# Patient Record
Sex: Male | Born: 1990 | Race: Black or African American | Hispanic: No | Marital: Single | Smoking: Current every day smoker
Health system: Southern US, Community
[De-identification: ages and names within clinical notes are randomized; demographics above are authoritative.]

## PROBLEM LIST (undated history)

## (undated) DIAGNOSIS — J45909 Unspecified asthma, uncomplicated: Secondary | ICD-10-CM

## (undated) HISTORY — PX: KNEE SURGERY: SHX244

---

## 2016-04-19 ENCOUNTER — Emergency Department (HOSPITAL_COMMUNITY)
Admission: EM | Admit: 2016-04-19 | Discharge: 2016-04-19 | Disposition: A | Discharge status: Home / Self Care | Payer: Self-pay | Attending: Emergency Medicine | Admitting: Emergency Medicine

## 2016-04-19 ENCOUNTER — Encounter (HOSPITAL_COMMUNITY): Payer: Self-pay | Admitting: *Deleted

## 2016-04-19 DIAGNOSIS — K0889 Other specified disorders of teeth and supporting structures: Secondary | ICD-10-CM | POA: Insufficient documentation

## 2016-04-19 DIAGNOSIS — Z79899 Other long term (current) drug therapy: Secondary | ICD-10-CM | POA: Insufficient documentation

## 2016-04-19 DIAGNOSIS — F17200 Nicotine dependence, unspecified, uncomplicated: Secondary | ICD-10-CM | POA: Insufficient documentation

## 2016-04-19 MED ORDER — NAPROXEN 500 MG PO TABS: 500.0000 mg | ORAL_TABLET | Freq: Two times a day (BID) | ORAL | 0 refills | Status: DC | PRN

## 2016-04-19 MED ORDER — PENICILLIN V POTASSIUM 500 MG PO TABS: 500.0000 mg | ORAL_TABLET | Freq: Four times a day (QID) | ORAL | 0 refills | Status: AC

## 2016-04-19 NOTE — ED Provider Notes (Signed)
[facility]-EMERGENCY DEPT Provider Note   CSN: [id number] Arrival date & time: 04/19/16  1534   By signing my name below, I, Clovis PuAvnee Patel, attest that this documentation has been prepared under the direction and in the presence of  [facility] HospitalEmily Gerhard Rappaport, PA-C. Electronically Signed: Clovis PuAvnee Patel, ED Scribe. 04/19/16. 4:04 PM.   History   Chief Complaint Chief Complaint  Patient presents with  . Dental Pain   The history is provided by the patient. No language interpreter was used.   HPI Comments:  Joe [name] is a 25 y.o. male, with a hx of dental pain, who presents to the Emergency Department complaining of sudden onset, moderate, right lower dental pain x yesterday. He states he has been intermittently experiencing these episodes for several months. Pt states he cannot talk properly due to the pain. He notes a hx of similar problem to another tooth which was later extracted by a dentist. He has tried Orajel with no relief. Pt denies facial swelling (states this has happened with previous episodes of pain), fevers, sore throat, trouble swallowing, any other associated symptoms any other and modifying factors at this time.    History reviewed. No pertinent past medical history.  There are no active problems to display for this patient.   History reviewed. No pertinent surgical history.   Home Medications    Prior to Admission medications   Medication Sig Start Date End Date Taking? Authorizing Provider  naproxen (NAPROSYN) 500 MG tablet Take 1 tablet (500 mg total) by mouth 2 (two) times daily as needed for mild pain or moderate pain. 04/19/16   Trixie DredgeEmily Christan Defranco, PA-C  penicillin v potassium (VEETID) 500 MG tablet Take 1 tablet (500 mg total) by mouth 4 (four) times daily. 04/19/16 04/26/16  Trixie DredgeEmily Mazin Emma, PA-C    Family History No family history on file.  Social History Social History  Substance Use Topics  . Smoking status: Current Every Day Smoker  . Smokeless tobacco: Never Used  . Alcohol use  Yes     Allergies   Patient has no allergy information on record.   Review of Systems Review of Systems  Constitutional: Negative for fever.  HENT: Positive for dental problem. Negative for facial swelling, sore throat and trouble swallowing.   Respiratory: Negative for shortness of breath and stridor.   Musculoskeletal: Negative for neck pain and neck stiffness.  Skin: Negative for color change.  Allergic/Immunologic: Negative for immunocompromised state.  Psychiatric/Behavioral: Negative for self-injury.     Physical Exam Updated Vital Signs BP 152/87 (BP Location: Right Arm)   Pulse 90   Temp 99 F (37.2 C) (Oral)   Resp 18   SpO2 100%   Physical Exam  Constitutional: He appears well-developed and well-nourished. No distress.  HENT:  Head: Normocephalic and atraumatic.  Mouth/Throat: Uvula is midline and oropharynx is clear and moist. Mucous membranes are not dry. No uvula swelling. No oropharyngeal exudate, posterior oropharyngeal edema, posterior oropharyngeal erythema or tonsillar abscesses.  Right lower second molar with remote fracture and tenderness to percussion. No obvious abscess or facial swelling   Neck: Normal range of motion. Neck supple.  Cardiovascular: Normal rate.   Pulmonary/Chest: Effort normal and breath sounds normal. No stridor.  Lymphadenopathy:    He has no cervical adenopathy.  Neurological: He is alert.  Skin: He is not diaphoretic.  Nursing note and vitals reviewed.    ED Treatments / Results  DIAGNOSTIC STUDIES:  Oxygen Saturation is 100% on RA, normal by my interpretation.  COORDINATION OF CARE:  3:59 PM Discussed treatment plan with pt at bedside and pt agreed to plan.  Labs (all labs ordered are listed, but only abnormal results are displayed) Labs Reviewed - No data to display  EKG  EKG Interpretation None       Radiology No results found.  Procedures Procedures (including critical care time)  Medications  Ordered in ED Medications - No data to display   Initial Impression / Assessment and Plan / ED Course  I have reviewed the triage vital signs and the nursing notes.  Pertinent labs & imaging results that were available during my care of the patient were reviewed by me and considered in my medical decision making (see chart for details).  Clinical Course     Afebrile nontoxic patient with dentalgia.  No abscess requiring immediate incision and drainage.  Exam not concerning for Ludwig's angina or pharyngeal abscess.  Will treat with penicillin, naprosyn, dental follow up.  Pt instructed to follow-up with dentist.  Discussed return precautions. Pt safe for discharge.  Discussed result, findings, treatment, and follow up  with patient.  Pt given return precautions.  Pt verbalizes understanding and agrees with plan.      Final Clinical Impressions(s) / ED Diagnoses   Final diagnoses:  Pain, dental    New Prescriptions Discharge Medication List as of 04/19/2016  4:03 PM    START taking these medications   Details  naproxen (NAPROSYN) 500 MG tablet Take 1 tablet (500 mg total) by mouth 2 (two) times daily as needed for mild pain or moderate pain., Starting Sun 04/19/2016, Print    penicillin v potassium (VEETID) 500 MG tablet Take 1 tablet (500 mg total) by mouth 4 (four) times daily., Starting Sun 04/19/2016, Until Sun 04/26/2016, Print        I personally performed the services described in this documentation, which was scribed in my presence. The recorded information has been reviewed and is accurate.     Trixie Dredgemily Mendi Constable, PA-C 04/19/16 1703    Arby BarretteMarcy Pfeiffer, MD 04/22/16 [phone]

## 2016-04-19 NOTE — ED Triage Notes (Signed)
Pt complains of pain in his right lower wisdom tooth for the past 2 days. Pt states pain is 7/10.

## 2016-04-19 NOTE — Discharge Instructions (Signed)
Read the information below.  Use the prescribed medication as directed.  Please discuss all new medications with your pharmacist.  You may return to the Emergency Department at any time for worsening condition or any new symptoms that concern you.   Please call the dentist listed above within 48 hours to schedule a close follow up appointment.  If you develop fevers, swelling in your face, difficulty swallowing or breathing, return to the ER immediately for a recheck.   °

## 2016-11-13 ENCOUNTER — Encounter (HOSPITAL_COMMUNITY): Payer: Self-pay | Admitting: Emergency Medicine

## 2016-11-13 ENCOUNTER — Emergency Department (HOSPITAL_COMMUNITY)
Admission: EM | Admit: 2016-11-13 | Discharge: 2016-11-13 | Disposition: A | Discharge status: Home / Self Care | Payer: Self-pay | Attending: Emergency Medicine | Admitting: Emergency Medicine

## 2016-11-13 DIAGNOSIS — R109 Unspecified abdominal pain: Secondary | ICD-10-CM | POA: Insufficient documentation

## 2016-11-13 DIAGNOSIS — Z202 Contact with and (suspected) exposure to infections with a predominantly sexual mode of transmission: Secondary | ICD-10-CM | POA: Insufficient documentation

## 2016-11-13 DIAGNOSIS — R112 Nausea with vomiting, unspecified: Secondary | ICD-10-CM | POA: Insufficient documentation

## 2016-11-13 DIAGNOSIS — F17210 Nicotine dependence, cigarettes, uncomplicated: Secondary | ICD-10-CM | POA: Insufficient documentation

## 2016-11-13 LAB — CBC
HCT: 42.6 % (ref 39.0–52.0)
HEMOGLOBIN: 14.3 g/dL (ref 13.0–17.0)
MCH: 30.5 pg (ref 26.0–34.0)
MCHC: 33.6 g/dL (ref 30.0–36.0)
MCV: 90.8 fL (ref 78.0–100.0)
Platelets: 257 10*3/uL (ref 150–400)
RBC: 4.69 MIL/uL (ref 4.22–5.81)
RDW: 12.6 % (ref 11.5–15.5)
WBC: 6.1 10*3/uL (ref 4.0–10.5)

## 2016-11-13 LAB — COMPREHENSIVE METABOLIC PANEL
ALBUMIN: 4.6 g/dL (ref 3.5–5.0)
ALK PHOS: 57 U/L (ref 38–126)
ALT: 11 U/L — AB (ref 17–63)
ANION GAP: 10 (ref 5–15)
AST: 23 U/L (ref 15–41)
BILIRUBIN TOTAL: 1.8 mg/dL — AB (ref 0.3–1.2)
BUN: 7 mg/dL (ref 6–20)
CALCIUM: 9.6 mg/dL (ref 8.9–10.3)
CO2: 26 mmol/L (ref 22–32)
Chloride: 105 mmol/L (ref 101–111)
Creatinine, Ser: 0.99 mg/dL (ref 0.61–1.24)
GFR calc Af Amer: >60 mL/min (ref >60)
GFR calc non Af Amer: >60 mL/min (ref >60)
GLUCOSE: 99 mg/dL (ref 65–99)
Potassium: 3.8 mmol/L (ref 3.5–5.1)
SODIUM: 141 mmol/L (ref 135–145)
Total Protein: 8.3 g/dL — ABNORMAL HIGH (ref 6.5–8.1)

## 2016-11-13 LAB — URINALYSIS, ROUTINE W REFLEX MICROSCOPIC
BILIRUBIN URINE: NEGATIVE
Glucose, UA: NEGATIVE mg/dL
HGB URINE DIPSTICK: NEGATIVE
KETONES UR: NEGATIVE mg/dL
Leukocytes, UA: NEGATIVE
Nitrite: NEGATIVE
PH: 5 (ref 5.0–8.0)
Protein, ur: NEGATIVE mg/dL
SPECIFIC GRAVITY, URINE: 1.018 (ref 1.005–1.030)

## 2016-11-13 MED ORDER — ONDANSETRON 4 MG PO TBDP: 4.0000 mg | ORAL_TABLET | Freq: Three times a day (TID) | ORAL | 0 refills | Status: DC | PRN

## 2016-11-13 MED ORDER — CEFTRIAXONE SODIUM 250 MG IJ SOLR
250.0000 mg | INTRAMUSCULAR | Status: DC
  Administered 2016-11-13: 250 mg via INTRAMUSCULAR
  Filled 2016-11-13: qty 250

## 2016-11-13 MED ORDER — AZITHROMYCIN 250 MG PO TABS
1000.0000 mg | ORAL_TABLET | Freq: Once | ORAL | Status: AC
  Administered 2016-11-13: 1000 mg via ORAL
  Filled 2016-11-13: qty 4

## 2016-11-13 MED ORDER — ONDANSETRON 4 MG PO TBDP
4.0000 mg | ORAL_TABLET | Freq: Once | ORAL | Status: AC
  Administered 2016-11-13: 4 mg via ORAL
  Filled 2016-11-13: qty 1

## 2016-11-13 MED ORDER — LIDOCAINE HCL (PF) 1 % IJ SOLN
INTRAMUSCULAR | Status: AC
  Administered 2016-11-13: 5 mL
  Filled 2016-11-13: qty 5

## 2016-11-13 MED ORDER — METRONIDAZOLE 500 MG PO TABS
2000.0000 mg | ORAL_TABLET | Freq: Once | ORAL | Status: AC
  Administered 2016-11-13: 2000 mg via ORAL
  Filled 2016-11-13: qty 4

## 2016-11-13 NOTE — ED Notes (Signed)
Pt unable to pee. Fluid given.

## 2016-11-13 NOTE — ED Provider Notes (Signed)
Emergency Department Provider Note   I have reviewed the triage vital signs and the nursing notes.   HISTORY  Chief Complaint Emesis and Exposure to STD   HPI Joe [name] is a 26 y.o. male with PMH of marijuana use presents to the emergency department for evaluation of multiple episodes of vomiting over the past 3 weeks and known STD exposure. The patient states that both he and his girlfriend has been having intermittent vomiting over the last 3 weeks. His last vomiting episode was 3 days ago. He states at that time he was here in the emergency department with his girlfriend when she tested positive for trichomonas. She was treated and he was encouraged to also be treated. He reports some occasional abdominal discomfort with vomiting but nothing persistent. Denies any burning with urination or urethral discharge. He does note some cloudy urination for the past 3 weeks.  Patient states that his vomiting does seem to have improved over the past 2 days. He smokes marijuana daily states that it typically makes his nausea and vomiting better as opposed to worse. No associated diarrhea. No other sick contacts other than his girlfriend.  History reviewed. No pertinent past medical history.  There are no active problems to display for this patient.   History reviewed. No pertinent surgical history.  Current Outpatient Rx  . Order #: [id number] Class: Print  . Order #: [id number] Class: Print    Allergies Patient has no allergy information on record.  History reviewed. No pertinent family history.  Social History Social History  Substance Use Topics  . Smoking status: Current Every Day Smoker  . Smokeless tobacco: Never Used  . Alcohol use Yes    Review of Systems  Constitutional: No fever/chills Eyes: No visual changes. ENT: No sore throat. Cardiovascular: Denies chest pain. Respiratory: Denies shortness of breath. Gastrointestinal: No abdominal pain. Positive nausea and  vomiting.  No diarrhea.  No constipation. Genitourinary: Negative for dysuria. Positive cloudy urination. Positive STD exposure.  Musculoskeletal: Negative for back pain. Skin: Negative for rash. Neurological: Negative for headaches, focal weakness or numbness.  10-point ROS otherwise negative.  ____________________________________________   PHYSICAL EXAM:  VITAL SIGNS: ED Triage Vitals  Enc Vitals Group     BP 11/13/16 1552 (!) 141/93     Pulse Rate 11/13/16 1552 (!) 115     Resp 11/13/16 1552 18     Temp 11/13/16 1552 98.5 F (36.9 C)     Temp Source 11/13/16 1552 Oral     SpO2 11/13/16 1552 98 %     Weight --      Height 11/13/16 1552 5\' 11"  (1.803 m)     Pain Score 11/13/16 1551 0   Constitutional: Alert and oriented. Well appearing and in no acute distress. Eyes: Conjunctivae are normal. Head: Atraumatic. Nose: No congestion/rhinnorhea. Mouth/Throat: Mucous membranes are moist. Neck: No stridor. Cardiovascular: Normal rate, regular rhythm. Good peripheral circulation. Grossly normal heart sounds.   Respiratory: Normal respiratory effort.  No retractions. Lungs CTAB. Gastrointestinal: Soft and nontender. No distention.  Musculoskeletal: No lower extremity tenderness nor edema. No gross deformities of extremities. Neurologic:  Normal speech and language. No gross focal neurologic deficits are appreciated.  Skin:  Skin is warm, dry and intact. No rash noted.  ____________________________________________   LABS (all labs ordered are listed, but only abnormal results are displayed)  Labs Reviewed  COMPREHENSIVE METABOLIC PANEL - Abnormal; Notable for the following:       Result Value   Total Protein  8.3 (*)    ALT 11 (*)    Total Bilirubin 1.8 (*)    All other components within normal limits  URINE CULTURE  CBC  URINALYSIS, ROUTINE W REFLEX MICROSCOPIC  GC/CHLAMYDIA PROBE AMP ([place]    ____________________________________________   PROCEDURES  Procedure(s) performed:   Procedures  None ____________________________________________   INITIAL IMPRESSION / ASSESSMENT AND PLAN / ED COURSE  Pertinent labs & imaging results that were available during my care of the patient were reviewed by me and considered in my medical decision making (see chart for details).  Patient presents to the emergency department for evaluation of intermittent vomiting over the last 3 weeks that seems to be improving. He is also concerned about STD exposure. His girlfriend tested positive for trichomonas. Labs from triage including UA are negative. Plan to send her urine for gonorrhea and chlamydia. Will treat empirically for trichomonas, chlamydia, gonorrhea. Patient has had no vomiting for the past 2-3 days. We'll discharge home with Zofran. I discussed that if vomiting and nausea persist he may be developing a cyclical vomiting type reaction to marijuana and he should try and decrease the amount he smokes. HR normalized from triage.   At this time, I do not feel there is any life-threatening condition present. I have reviewed and discussed all results (EKG, imaging, lab, urine as appropriate), exam findings with patient. I have reviewed nursing notes and appropriate previous records.  I feel the patient is safe to be discharged home without further emergent workup. Discussed usual and customary return precautions. Patient and family (if present) verbalize understanding and are comfortable with this plan.  Patient will follow-up with their primary care provider. If they do not have a primary care provider, information for follow-up has been provided to them. All questions have been answered.  ____________________________________________  FINAL CLINICAL IMPRESSION(S) / ED DIAGNOSES  Final diagnoses:  Non-intractable vomiting with nausea, unspecified vomiting type  STD exposure      MEDICATIONS GIVEN DURING THIS VISIT:  Medications  cefTRIAXone (ROCEPHIN) injection 250 mg (250 mg Intramuscular Given 11/13/16 2142)  azithromycin (ZITHROMAX) tablet 1,000 mg (1,000 mg Oral Given 11/13/16 2141)  metroNIDAZOLE (FLAGYL) tablet 2,000 mg (2,000 mg Oral Given 11/13/16 2142)  ondansetron (ZOFRAN-ODT) disintegrating tablet 4 mg (4 mg Oral Given 11/13/16 2142)  lidocaine (PF) (XYLOCAINE) 1 % injection (5 mLs  Given 11/13/16 2142)     NEW OUTPATIENT MEDICATIONS STARTED DURING THIS VISIT:  Discharge Medication List as of 11/13/2016  8:28 PM    START taking these medications   Details  ondansetron (ZOFRAN ODT) 4 MG disintegrating tablet Take 1 tablet (4 mg total) by mouth every 8 (eight) hours as needed for nausea or vomiting., Starting Fri 11/13/2016, Print          Note:  This document was prepared using Dragon voice recognition software and may include unintentional dictation errors.  Alona BeneJoshua Nevah Dalal, MD Emergency Medicine    Joe Gee, Arlyss RepressJoshua G, MD 11/14/16 [id number]

## 2016-11-13 NOTE — Discharge Instructions (Signed)

## 2016-11-13 NOTE — ED Notes (Signed)
Pt verbalized understanding discharge instructions and denies any further needs or questions at this time. VS stable, ambulatory and steady gait.   

## 2016-11-13 NOTE — ED Triage Notes (Signed)
Pt to ER for evaluation of emesis x20+ episodes for 12 days, states however he has not had any vomiting in 3 days. Also here because states girlfriend was diagnosed with STD (pt does not know what) and was told he needed to be checked. States urine is cloudy but denies discharge or pain. Pt is a/o x4.

## 2016-11-15 LAB — URINE CULTURE: Culture: <10000 — AB

## 2016-11-16 LAB — GC/CHLAMYDIA PROBE AMP (~~LOC~~) NOT AT ARMC
Chlamydia: NEGATIVE
Neisseria Gonorrhea: NEGATIVE

## 2017-01-11 ENCOUNTER — Emergency Department (HOSPITAL_COMMUNITY)
Admission: EM | Admit: 2017-01-11 | Discharge: 2017-01-11 | Disposition: A | Discharge status: Home / Self Care | Payer: Self-pay | Attending: Emergency Medicine | Admitting: Emergency Medicine

## 2017-01-11 ENCOUNTER — Encounter (HOSPITAL_COMMUNITY): Payer: Self-pay | Admitting: *Deleted

## 2017-01-11 DIAGNOSIS — Y929 Unspecified place or not applicable: Secondary | ICD-10-CM | POA: Insufficient documentation

## 2017-01-11 DIAGNOSIS — Y939 Activity, unspecified: Secondary | ICD-10-CM | POA: Insufficient documentation

## 2017-01-11 DIAGNOSIS — Y998 Other external cause status: Secondary | ICD-10-CM | POA: Insufficient documentation

## 2017-01-11 DIAGNOSIS — S51812A Laceration without foreign body of left forearm, initial encounter: Secondary | ICD-10-CM

## 2017-01-11 DIAGNOSIS — Z23 Encounter for immunization: Secondary | ICD-10-CM | POA: Insufficient documentation

## 2017-01-11 DIAGNOSIS — F17200 Nicotine dependence, unspecified, uncomplicated: Secondary | ICD-10-CM | POA: Insufficient documentation

## 2017-01-11 DIAGNOSIS — J45909 Unspecified asthma, uncomplicated: Secondary | ICD-10-CM | POA: Insufficient documentation

## 2017-01-11 DIAGNOSIS — W260XXA Contact with knife, initial encounter: Secondary | ICD-10-CM | POA: Insufficient documentation

## 2017-01-11 HISTORY — DX: Unspecified asthma, uncomplicated: J45.909

## 2017-01-11 MED ORDER — TETANUS-DIPHTH-ACELL PERTUSSIS 5-2.5-18.5 LF-MCG/0.5 IM SUSP
0.5000 mL | Freq: Once | INTRAMUSCULAR | Status: AC
  Administered 2017-01-11: 0.5 mL via INTRAMUSCULAR
  Filled 2017-01-11: qty 0.5
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
## 2017-01-11 NOTE — Discharge Instructions (Signed)
It was my pleasure taking care of you today!   You do not have to bandage your cut. The adhesive glue I applied works like a bandage. Do not use antibiotic ointment as it can break down the adhesive. You can shower while the adhesive is on your skin, but do not take a bath or soak or scrub the area for 7 to 10 days. Dry your skin by patting it gently with a towel.  The adhesive will peel off on its own, usually by 5 to 10 days. If after 10 days, you still have adhesive on you, you can use antibiotic ointment or petroleum jelly to get it off. You do not need to see the doctor again unless the wound doesn?t heal well or you have signs of infection, such as redness, swelling, or pus.  Return to ER for fever, redness or swelling around the cut, pus drains from the cut, new or worsening symptoms, any additional concerns.
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
## 2017-07-14 ENCOUNTER — Emergency Department (HOSPITAL_COMMUNITY)
Admission: EM | Admit: 2017-07-14 | Discharge: 2017-07-15 | Disposition: A | Discharge status: Home / Self Care | Payer: Self-pay | Attending: Emergency Medicine | Admitting: Emergency Medicine

## 2017-07-14 ENCOUNTER — Encounter (HOSPITAL_COMMUNITY): Payer: Self-pay | Admitting: Emergency Medicine

## 2017-07-14 DIAGNOSIS — K0889 Other specified disorders of teeth and supporting structures: Secondary | ICD-10-CM

## 2017-07-14 DIAGNOSIS — J45909 Unspecified asthma, uncomplicated: Secondary | ICD-10-CM | POA: Insufficient documentation

## 2017-07-14 DIAGNOSIS — F17200 Nicotine dependence, unspecified, uncomplicated: Secondary | ICD-10-CM | POA: Insufficient documentation

## 2017-07-14 DIAGNOSIS — K029 Dental caries, unspecified: Secondary | ICD-10-CM | POA: Insufficient documentation

## 2017-07-14 NOTE — ED Triage Notes (Signed)
Reports having bad tooth on left lower side with a tooth that broke off last year.  Noted to be red and tender to touch.

## 2017-07-15 MED ORDER — PENICILLIN V POTASSIUM 250 MG PO TABS
500.0000 mg | ORAL_TABLET | Freq: Once | ORAL | Status: AC
  Administered 2017-07-15: 500 mg via ORAL
  Filled 2017-07-15: qty 2

## 2017-07-15 MED ORDER — PENICILLIN V POTASSIUM 500 MG PO TABS: 500.0000 mg | ORAL_TABLET | Freq: Four times a day (QID) | ORAL | 0 refills | Status: AC

## 2017-07-15 MED ORDER — NAPROXEN 250 MG PO TABS
500.0000 mg | ORAL_TABLET | Freq: Once | ORAL | Status: AC
  Administered 2017-07-15: 500 mg via ORAL
  Filled 2017-07-15: qty 2

## 2017-07-15 NOTE — ED Notes (Signed)
ED Provider at bedside. 

## 2017-07-15 NOTE — ED Provider Notes (Signed)
[facility] EMERGENCY DEPARTMENT Provider Note   CSN: [id number] Arrival date & time: 07/14/17  2222     History   Chief Complaint Chief Complaint  Patient presents with  . Dental Pain    HPI Joe [name] is a 27 y.o. male.  The history is provided by the patient. No language interpreter was used.  Dental Pain   This is a new problem. Episode onset: 1 week ago. The problem occurs daily. The problem has been gradually worsening. The pain is moderate. Treatments tried: ibuprofen 800mg . The treatment provided moderate relief.    Past Medical History:  Diagnosis Date  . Asthma     There are no active problems to display for this patient.   History reviewed. No pertinent surgical history.     Home Medications    Prior to Admission medications   Medication Sig Start Date End Date Taking? Authorizing Provider  naproxen (NAPROSYN) 500 MG tablet Take 1 tablet (500 mg total) by mouth 2 (two) times daily as needed for mild pain or moderate pain. 04/19/16   Trixie Dredge, PA-C  ondansetron (ZOFRAN ODT) 4 MG disintegrating tablet Take 1 tablet (4 mg total) by mouth every 8 (eight) hours as needed for nausea or vomiting. 11/13/16   Long, Arlyss Repress, MD  penicillin v potassium (VEETID) 500 MG tablet Take 1 tablet (500 mg total) by mouth 4 (four) times daily for 10 days. 07/15/17 07/25/17  Antony Madura, PA-C    Family History No family history on file.  Social History Social History   Tobacco Use  . Smoking status: Current Every Day Smoker  . Smokeless tobacco: Never Used  Substance Use Topics  . Alcohol use: Yes  . Drug use: Yes    Types: Marijuana     Allergies   Patient has no known allergies.   Review of Systems Review of Systems Ten systems reviewed and are negative for acute change, except as noted in the HPI.    Physical Exam Updated Vital Signs BP (!) 153/85 (BP Location: Right Arm)   Pulse 90   Temp 98.1 F (36.7 C) (Oral)   Resp 16   Ht  6' (1.829 m)   Wt 86.2 kg (190 lb)   SpO2 100%   BMI 25.77 kg/m   Physical Exam  Constitutional: He is oriented to person, place, and time. He appears well-developed and well-nourished. No distress.  Nontoxic appearing and in no distress  HENT:  Head: Normocephalic and atraumatic.  Gross dental decay and caries to the left lower wisdom tooth.  No trismus.  Patient tolerating secretions without difficulty.  No facial swelling.  Eyes: Conjunctivae and EOM are normal. No scleral icterus.  Neck: Normal range of motion.  No meningismus  Pulmonary/Chest: Effort normal. No respiratory distress.  Respirations even and unlabored  Musculoskeletal: Normal range of motion.  Neurological: He is alert and oriented to person, place, and time. He exhibits normal muscle tone. Coordination normal.  Skin: Skin is warm and dry. No rash noted. He is not diaphoretic. No erythema. No pallor.  Psychiatric: He has a normal mood and affect. His behavior is normal.  Nursing note and vitals reviewed.    ED Treatments / Results  Labs (all labs ordered are listed, but only abnormal results are displayed) Labs Reviewed - No data to display  EKG None  Radiology No results found.  Procedures Procedures (including critical care time)  Medications Ordered in ED Medications  penicillin v potassium (VEETID) tablet  500 mg (has no administration in time range)  naproxen (NAPROSYN) tablet 500 mg (has no administration in time range)     Initial Impression / Assessment and Plan / ED Course  I have reviewed the triage vital signs and the nursing notes.  Pertinent labs & imaging results that were available during my care of the patient were reviewed by me and considered in my medical decision making (see chart for details).     Patient with toothache.  No gross abscess.  Exam unconcerning for Ludwig's angina or spread of infection.  Will treat with penicillin and pain medicine.  Urged patient to follow-up  with dentist.  Return precautions discussed and provided. Patient discharged in stable condition with no unaddressed concerns.   Final Clinical Impressions(s) / ED Diagnoses   Final diagnoses:  Pain, dental    ED Discharge Orders        Ordered    penicillin v potassium (VEETID) 500 MG tablet  4 times daily     07/15/17 0011       Antony MaduraHumes, Breean Nannini, PA-C 07/15/17 0015    Ward, Layla MawKristen N, DO 07/15/17 [phone]

## 2017-10-14 ENCOUNTER — Other Ambulatory Visit: Payer: Self-pay

## 2017-10-15 ENCOUNTER — Emergency Department (HOSPITAL_COMMUNITY)
Admission: EM | Admit: 2017-10-15 | Discharge: 2017-10-16 | Disposition: A | Discharge status: Home / Self Care | Payer: Self-pay | Attending: Emergency Medicine | Admitting: Emergency Medicine

## 2017-10-15 ENCOUNTER — Encounter (HOSPITAL_COMMUNITY): Payer: Self-pay

## 2017-10-15 ENCOUNTER — Emergency Department (HOSPITAL_COMMUNITY): Payer: Self-pay

## 2017-10-15 ENCOUNTER — Other Ambulatory Visit: Payer: Self-pay

## 2017-10-15 DIAGNOSIS — F17210 Nicotine dependence, cigarettes, uncomplicated: Secondary | ICD-10-CM | POA: Insufficient documentation

## 2017-10-15 DIAGNOSIS — F1510 Other stimulant abuse, uncomplicated: Secondary | ICD-10-CM | POA: Diagnosis present

## 2017-10-15 DIAGNOSIS — F1220 Cannabis dependence, uncomplicated: Secondary | ICD-10-CM | POA: Insufficient documentation

## 2017-10-15 DIAGNOSIS — R45851 Suicidal ideations: Secondary | ICD-10-CM | POA: Insufficient documentation

## 2017-10-15 DIAGNOSIS — F315 Bipolar disorder, current episode depressed, severe, with psychotic features: Secondary | ICD-10-CM | POA: Insufficient documentation

## 2017-10-15 DIAGNOSIS — R55 Syncope and collapse: Secondary | ICD-10-CM

## 2017-10-15 DIAGNOSIS — J45909 Unspecified asthma, uncomplicated: Secondary | ICD-10-CM | POA: Insufficient documentation

## 2017-10-15 DIAGNOSIS — F322 Major depressive disorder, single episode, severe without psychotic features: Secondary | ICD-10-CM | POA: Diagnosis present

## 2017-10-15 LAB — RAPID URINE DRUG SCREEN, HOSP PERFORMED
AMPHETAMINES: NOT DETECTED
BENZODIAZEPINES: NOT DETECTED
COCAINE: NOT DETECTED
Opiates: NOT DETECTED
Tetrahydrocannabinol: POSITIVE — AB

## 2017-10-15 LAB — URINALYSIS, ROUTINE W REFLEX MICROSCOPIC
Bilirubin Urine: NEGATIVE
Glucose, UA: NEGATIVE mg/dL
HGB URINE DIPSTICK: NEGATIVE
KETONES UR: NEGATIVE mg/dL
Leukocytes, UA: NEGATIVE
NITRITE: NEGATIVE
Protein, ur: NEGATIVE mg/dL
Specific Gravity, Urine: 1.018 (ref 1.005–1.030)
pH: 6 (ref 5.0–8.0)

## 2017-10-15 LAB — BASIC METABOLIC PANEL
ANION GAP: 10 (ref 5–15)
BUN: 13 mg/dL (ref 6–20)
CO2: 26 mmol/L (ref 22–32)
Calcium: 9.6 mg/dL (ref 8.9–10.3)
Chloride: 106 mmol/L (ref 98–111)
Creatinine, Ser: 1.01 mg/dL (ref 0.61–1.24)
GLUCOSE: 86 mg/dL (ref 70–99)
Potassium: 3.8 mmol/L (ref 3.5–5.1)
Sodium: 142 mmol/L (ref 135–145)

## 2017-10-15 LAB — CBC
HEMATOCRIT: 43.7 % (ref 39.0–52.0)
HEMOGLOBIN: 14.1 g/dL (ref 13.0–17.0)
MCH: 30 pg (ref 26.0–34.0)
MCHC: 32.3 g/dL (ref 30.0–36.0)
MCV: 93 fL (ref 78.0–100.0)
Platelets: 220 10*3/uL (ref 150–400)
RBC: 4.7 MIL/uL (ref 4.22–5.81)
RDW: 12 % (ref 11.5–15.5)
WBC: 4.6 10*3/uL (ref 4.0–10.5)

## 2017-10-15 LAB — ETHANOL: Alcohol, Ethyl (B): <10 mg/dL (ref <10)

## 2017-10-15 LAB — LIPASE, BLOOD: LIPASE: 28 U/L (ref 11–51)

## 2017-10-15 NOTE — ED Provider Notes (Signed)
[facility] EMERGENCY DEPARTMENT Provider Note   CSN: [id number] Arrival date & time: 10/15/17  1500  History   Chief Complaint Chief Complaint  Patient presents with  . Loss of Consciousness  . Emesis    HPI Adem Costlow is a 27 y.o. male.  The history is provided by the patient and a relative.   27 yo M with bipolar disorder (not actively seeing psychiatry, not currently on medication) who presents with recurrently syncope x 3 months. States this occurs approximately once per week, though occurred twice today. States it occurs after he stands up and starts walking. Reports lightheadedness, occasional vomiting, palpitations, then syncope for a few seconds. Returns to baseline quickly. No chest pain or dyspnea. No leg swelling. No known cardiac history. No sz disorder. No family history of unexplained deaths or cardiac arrhythmias.  Admits to increased stress recently, endorses SI with plan to run in front of a car or kill himself with a gun, though he does not have access to one. Lives at home with his fiance and many family members, states he does not feel safe going home given his ongoing suicidal thoughts. Endorses occasional EtOH use, tobacco use, marijuana use. Denies recent head trauma, fevers, numbness, weakness, vision changes.   Past Medical History:  Diagnosis Date  . Asthma     There are no active problems to display for this patient.   History reviewed. No pertinent surgical history.      Home Medications    Prior to Admission medications   Medication Sig Start Date End Date Taking? Authorizing Provider  naproxen (NAPROSYN) 500 MG tablet Take 1 tablet (500 mg total) by mouth 2 (two) times daily as needed for mild pain or moderate pain. 04/19/16   Trixie Dredge, PA-C  ondansetron (ZOFRAN ODT) 4 MG disintegrating tablet Take 1 tablet (4 mg total) by mouth every 8 (eight) hours as needed for nausea or vomiting. 11/13/16   Long, Arlyss Repress, MD    Family  History History reviewed. No pertinent family history.  Social History Social History   Tobacco Use  . Smoking status: Current Every Day Smoker    Packs/day: 0.50    Types: Cigarettes  . Smokeless tobacco: Never Used  Substance Use Topics  . Alcohol use: Yes    Comment: occ  . Drug use: Yes    Types: Marijuana    Comment: occ     Allergies   Patient has no known allergies.   Review of Systems Review of Systems  Constitutional: Negative for chills and fever.  HENT: Negative for ear pain and sore throat.   Eyes: Negative for pain and visual disturbance.  Respiratory: Negative for cough and shortness of breath.   Cardiovascular: Negative for chest pain and palpitations.  Gastrointestinal: Negative for abdominal pain and vomiting.  Genitourinary: Negative for dysuria and hematuria.  Musculoskeletal: Negative for arthralgias and back pain.  Skin: Negative for color change and rash.  Neurological: Positive for syncope. Negative for seizures.  Psychiatric/Behavioral: Positive for dysphoric mood and suicidal ideas.  All other systems reviewed and are negative.    Physical Exam Updated Vital Signs BP 124/72 (BP Location: Right Arm)   Pulse 76   Temp 98.6 F (37 C) (Oral)   Resp 16   Ht 5\' 11"  (1.803 m)   Wt 95.3 kg (210 lb)   SpO2 100%   BMI 29.29 kg/m   Physical Exam  Constitutional: He is oriented to person, place, and time. He  appears well-developed and well-nourished.  HENT:  Head: Normocephalic and atraumatic.  Eyes: Pupils are equal, round, and reactive to light. Conjunctivae and EOM are normal.  Neck: Neck supple.  Cardiovascular: Normal rate, regular rhythm and intact distal pulses.  No murmur heard. Pulmonary/Chest: Effort normal and breath sounds normal. No stridor. No respiratory distress.  Abdominal: Soft. He exhibits no distension. There is no tenderness.  Musculoskeletal: Normal range of motion. He exhibits tenderness. He exhibits no edema.  LUE:  ttp over distal radius with no deformity, FROM, NVI distally, no tenderness over anatomic snuff box  Neurological: He is alert and oriented to person, place, and time. No cranial nerve deficit or sensory deficit.  CN II-XII intact, intact strength and sensation throughout, neg pronator drift  Skin: Skin is warm and dry.  Psychiatric: He has a normal mood and affect.  Nursing note and vitals reviewed.    ED Treatments / Results  Labs (all labs ordered are listed, but only abnormal results are displayed) Labs Reviewed  RAPID URINE DRUG SCREEN, HOSP PERFORMED - Abnormal; Notable for the following components:      Result Value   Tetrahydrocannabinol POSITIVE (*)    Barbiturates   (*)    Value: Result not available. Reagent lot number recalled by manufacturer.   All other components within normal limits  BASIC METABOLIC PANEL  CBC  URINALYSIS, ROUTINE W REFLEX MICROSCOPIC  LIPASE, BLOOD  ETHANOL    EKG EKG Interpretation  Date/Time:  Thursday October 14 2017 15:21:34 EDT Ventricular Rate:  88 PR Interval:  150 QRS Duration: 90 QT Interval:  348 QTC Calculation: 421 R Axis:   83 Text Interpretation:  Normal sinus rhythm Normal ECG NO STEMI No old tracing to compare Confirmed by Drema Pryardama, Pedro [id number]) on 10/15/2017 11:47:33 PM   Radiology Dg Chest 2 View  Result Date: 10/15/2017 CLINICAL DATA:  Vomiting and passing out EXAM: CHEST - 2 VIEW COMPARISON:  None. FINDINGS: The heart size and mediastinal contours are within normal limits. Both lungs are clear. The visualized skeletal structures are unremarkable. IMPRESSION: No active cardiopulmonary disease. Electronically Signed   By: Gerome Samavid  Williams III M.D   On: 10/15/2017 21:16   Dg Wrist Complete Left  Result Date: 10/15/2017 CLINICAL DATA:  Pain.  Syncope. EXAM: LEFT WRIST - COMPLETE 3+ VIEW COMPARISON:  None. FINDINGS: Lunotriquetral coalition. A lucency within this carpal bone is likely a bone cysts. An erosion is less likely. No  fractures identified. No other acute abnormalities. IMPRESSION: 1. Coalition of the lunate and triquetrum. The lucency within this carpal bone is likely a bone cyst. An erosion is considered less likely. No acute abnormalities seen. Electronically Signed   By: Gerome Samavid  Williams III M.D   On: 10/15/2017 21:18    Procedures Procedures (including critical care time)  Medications Ordered in ED Medications  ibuprofen (ADVIL,MOTRIN) tablet 600 mg (has no administration in time range)     Initial Impression / Assessment and Plan / ED Course  I have reviewed the triage vital signs and the nursing notes.  Pertinent labs & imaging results that were available during my care of the patient were reviewed by me and considered in my medical decision making (see chart for details).     Howie IllLeo Rackers is a 27 y.o. male with PMHx of bipolar disorder (not actively seeing psychiatry, not currently on medication) who p/w recurrent syncope. Reviewed and confirmed nursing documentation for past medical history, family history, social history. HDS. Exam remarkable for nl neurologic  and cardiopulmonary exam. Favor orthostatic lightheadness and syncope given symptoms occur when he stands up quickly. Has prodromal symptoms, less concerning for arrhythmia. Concern for mental health instability given worsening suicidal thoughts, unable to contract for safety.   Suicidal precautions given. EKG unremarkable. BMP wnl. CBC wnl. CXR neg. UDS + for THC. UA negative. Alcohol neg. Orthostatics wnl. Patient is medically clear at this time.   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 Patient states he has had ongoing issues with this wrist for many years. Recommended he continued to wear brace.   Consulted TTS, who performed telepsych eval. Patient providing different stories, now states SI was 1 year ago but reports conversations with his deceased family members. They believe he warrants  psychiatry consultation the AM.   Old records reviewed. Labs reviewed by me and used in the medical decision making.  Imaging viewed and interpreted by me and used in the medical decision making (formal interpretation from radiologist). EKG reviewed by me and used in the medical decision making. Care given over to [facility]. Placed in psych hold.    Final Clinical Impressions(s) / ED Diagnoses   Final diagnoses:  Syncope, unspecified syncope type  Suicidal ideation      Diannia Ruder, MD 10/16/17 Julienne Kass    Azalia Bilis, MD 10/16/17 2249

## 2017-10-15 NOTE — ED Notes (Signed)
Telepsych notified that pt is ready for telepsych. Pt will be moved into trauma B for telepsych.

## 2017-10-15 NOTE — ED Triage Notes (Signed)
Pt endorses "i've been throwing up and passing out off and on for the past 3 months" No neuro deficits. Axox4.

## 2017-10-15 NOTE — ED Notes (Signed)
No answer for vital recheck 

## 2017-10-16 DIAGNOSIS — F1510 Other stimulant abuse, uncomplicated: Secondary | ICD-10-CM | POA: Diagnosis present

## 2017-10-16 DIAGNOSIS — F322 Major depressive disorder, single episode, severe without psychotic features: Secondary | ICD-10-CM | POA: Diagnosis present

## 2017-10-16 DIAGNOSIS — R45851 Suicidal ideations: Secondary | ICD-10-CM

## 2017-10-16 HISTORY — DX: Major depressive disorder, single episode, severe without psychotic features: F32.2

## 2017-10-16 HISTORY — DX: Other stimulant abuse, uncomplicated: F15.10

## 2017-10-16 MED ORDER — IBUPROFEN 400 MG PO TABS: 600.0000 mg | ORAL_TABLET | Freq: Three times a day (TID) | ORAL | Status: DC | PRN

## 2017-10-16 NOTE — ED Notes (Signed)
Updated patient on plan of care.

## 2017-10-16 NOTE — ED Notes (Signed)
Pt's visitor brought food for patient, pt posing for photos smiling and thumbs up

## 2017-10-16 NOTE — Discharge Instructions (Addendum)
Follow-up as instructed by behavioral health 

## 2017-10-16 NOTE — ED Notes (Signed)
Mother called and sending Fiance to pick up Pt.

## 2017-10-16 NOTE — ED Notes (Signed)
Family at the bedside. Brought Pt clothes

## 2017-10-16 NOTE — Consult Note (Addendum)
Telepsych Consultation   Reason for Consult:  IVCd with reported Si Referring Physician:  Dr. Eddie Dibbles Location of Patient:  Location of Provider: [place] Department  Patient Identification: Joe [name] MRN:  [id number] Principal Diagnosis: MDD (major depressive disorder), severe ([place]) Diagnosis:   Patient Active Problem List   Diagnosis Date Noted  . 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 MDD (major depressive disorder), severe ([place]) [F32.2] 10/16/2017    Total Time spent with patient: 30 minutes  Subjective:   Joe [name] is a 27 y.o. male patient reports that he was brought into the emergency department due to having some syncopal episodes.  Today patient denies any SI/HI/AVH and contracts for safety.  When asked patient if he does have hallucinations he reports that he talks to his grandmother who is deceased.  Patient reports that he lives with numerous family members and that they are all supportive.  He gives [place] permission to contact his mother Chestine Spore for collateral information.  Patient then asked me if he can stay until Monday and when asked why he stated that he just wanted to make sure that he was okay.  HPI: 27 year old single male who presented unaccompanied to [facility] reporting depressive symptoms including SI.  Initial complaint upon reporting to the ED was vomiting and passing out patient then stated he had a history of bipolar and is not currently taking his medications.  Patient seen by me today via tele-psych and he is cleared psychiatrically and does not meet inpatient treatment.  Patient continued to deny SI/HI/AVH and contracts for safety.  Patient's mother Chestine Spore was contacted by CSW and they feel safe with patient returning home and states that they will be supportive of patient.  Patient agreed to accept resource information for outpatient treatment.  Patient seems to be seeking some kind of secondary gain by requesting to  stay here until Monday, but there is no definitive answer on what he is seeking.  I have contacted Dr. Roderic Palau and notified him of our recommendation for discharge.  Past Psychiatric History: Denies any history, but it is reported in ED note that he has a history of psychiatric issues. He denies any medications  Risk to Self: Suicidal Ideation: Yes-Currently Present Suicidal Intent: No Is patient at risk for suicide?: Yes Suicidal Plan?: Yes-Currently Present Specify Current Suicidal Plan: Run into traffic or shoot himself Access to Means: Yes Specify Access to Suicidal Means: Access to traffic What has been your use of drugs/alcohol within the last 12 months?: Pt reports using marijuana daily How many times?: 1 Other Self Harm Risks: None Triggers for Past Attempts: Other personal contacts Intentional Self Injurious Behavior: None Risk to Others: Homicidal Ideation: No Thoughts of Harm to Others: No Current Homicidal Intent: No Current Homicidal Plan: No Access to Homicidal Means: No Identified Victim: None History of harm to others?: No Assessment of Violence: None Noted Violent Behavior Description: Pt denies history of violence Does patient have access to weapons?: No Criminal Charges Pending?: No Does patient have a court date: No Prior Inpatient Therapy: Prior Inpatient Therapy: No Prior Outpatient Therapy: Prior Outpatient Therapy: No Does patient have an ACCT team?: No Does patient have Intensive In-House Services?  : No Does patient have [facility] services? : No Does patient have P4CC services?: No  Past Medical History:  Past Medical History:  Diagnosis Date  . Asthma    History reviewed. No pertinent surgical history. Family History: History reviewed. No pertinent family history.  Family Psychiatric  History: Denies Social History:  Social History   Substance and Sexual Activity  Alcohol Use Yes   Comment: occ     Social History   Substance and Sexual  Activity  Drug Use Yes  . Types: Marijuana   Comment: occ    Social History   Socioeconomic History  . Marital status: Single    Spouse name: Not on file  . Number of children: Not on file  . Years of education: Not on file  . Highest education level: Not on file  Occupational History  . Not on file  Social Needs  . Financial resource strain: Not on file  . Food insecurity:    Worry: Not on file    Inability: Not on file  . Transportation needs:    Medical: Not on file    Non-medical: Not on file  Tobacco Use  . Smoking status: Current Every Day Smoker    Packs/day: 0.50    Types: Cigarettes  . Smokeless tobacco: Never Used  Substance and Sexual Activity  . Alcohol use: Yes    Comment: occ  . Drug use: Yes    Types: Marijuana    Comment: occ  . Sexual activity: Not on file  Lifestyle  . Physical activity:    Days per week: Not on file    Minutes per session: Not on file  . Stress: Not on file  Relationships  . Social connections:    Talks on phone: Not on file    Gets together: Not on file    Attends religious service: Not on file    Active member of club or organization: Not on file    Attends meetings of clubs or organizations: Not on file    Relationship status: Not on file  Other Topics Concern  . Not on file  Social History Narrative  . Not on file   Additional Social History:    Allergies:  No Known Allergies  Labs:  Results for orders placed or performed during the hospital encounter of 10/15/17 (from the past 48 hour(s))  Basic metabolic panel     Status: None   Collection Time: 10/15/17  3:39 PM  Result Value Ref Range   Sodium 142 135 - 145 mmol/L   Potassium 3.8 3.5 - 5.1 mmol/L   Chloride 106 98 - 111 mmol/L    Comment: Please note change in reference range.   CO2 26 22 - 32 mmol/L   Glucose, Bld 86 70 - 99 mg/dL    Comment: Please note change in reference range.   BUN 13 6 - 20 mg/dL    Comment: Please note change in reference range.    Creatinine, Ser 1.01 0.61 - 1.24 mg/dL   Calcium 9.6 8.9 - 10.3 mg/dL   GFR calc non Af Amer >60 >60 mL/min   GFR calc Af Amer >60 >60 mL/min    Comment: (NOTE) The eGFR has been calculated using the CKD EPI equation. This calculation has not been validated in all clinical situations. eGFR's persistently <60 mL/min signify possible Chronic Kidney Disease.    Anion gap 10 5 - 15    Comment: Performed at [facility] [address] [place] [address]  CBC     Status: None   Collection Time: 10/15/17  3:39 PM  Result Value Ref Range   WBC 4.6 4.0 - 10.5 K/uL   RBC 4.70 4.22 - 5.81 MIL/uL   Hemoglobin 14.1 13.0 -  17.0 g/dL   HCT 43.7 39.0 - 52.0 %   MCV 93.0 78.0 - 100.0 fL   MCH 30.0 26.0 - 34.0 pg   MCHC 32.3 30.0 - 36.0 g/dL   RDW 12.0 11.5 - 15.5 %   Platelets 220 150 - 400 K/uL    Comment: Performed at [facility], [place] [address], [place], [place] [address]  Lipase, blood     Status: None   Collection Time: 10/15/17  3:39 PM  Result Value Ref Range   Lipase 28 11 - 51 U/L    Comment: Performed at [facility], [place] [address], [place], [place] [address]  Ethanol     Status: None   Collection Time: 10/15/17  9:25 PM  Result Value Ref Range   Alcohol, Ethyl (B) <10 <10 mg/dL    Comment: (NOTE) Lowest detectable limit for serum alcohol is 10 mg/dL. For medical purposes only. Performed at [facility], [place] [address] [place], [place] [address]   Urinalysis, Routine w reflex microscopic     Status: None   Collection Time: 10/15/17 10:50 PM  Result Value Ref Range   Color, Urine YELLOW YELLOW   APPearance CLEAR CLEAR   Specific Gravity, Urine 1.018 1.005 - 1.030   pH 6.0 5.0 - 8.0   Glucose, UA NEGATIVE NEGATIVE mg/dL   Hgb urine dipstick NEGATIVE NEGATIVE   Bilirubin Urine NEGATIVE NEGATIVE   Ketones, ur NEGATIVE NEGATIVE mg/dL   Protein, ur NEGATIVE NEGATIVE mg/dL   Nitrite NEGATIVE NEGATIVE   Leukocytes, UA NEGATIVE NEGATIVE     Comment: Performed at [facility] [address], [place], [place] [address]  Urine rapid drug screen (hosp performed)     Status: Abnormal   Collection Time: 10/15/17 10:50 PM  Result Value Ref Range   Opiates NONE DETECTED NONE DETECTED   Cocaine NONE DETECTED NONE DETECTED   Benzodiazepines NONE DETECTED NONE DETECTED   Amphetamines NONE DETECTED NONE DETECTED   Tetrahydrocannabinol POSITIVE (A) NONE DETECTED   Barbiturates (A) NONE DETECTED    Result not available. Reagent lot number recalled by manufacturer.    Comment: Performed at [facility], [place] [address] [place], [place] [address]    Medications:  Current Facility-Administered Medications  Medication Dose Route Frequency Provider Last Rate Last Dose  . ibuprofen (ADVIL,MOTRIN) tablet 600 mg  600 mg Oral Q8H PRN Norm Salt, MD       Current Outpatient Medications  Medication Sig Dispense Refill  . naproxen (NAPROSYN) 500 MG tablet Take 1 tablet (500 mg total) by mouth 2 (two) times daily as needed for mild pain or moderate pain. (Patient not taking: Reported on 10/16/2017) 20 tablet 0  . ondansetron (ZOFRAN ODT) 4 MG disintegrating tablet Take 1 tablet (4 mg total) by mouth every 8 (eight) hours as needed for nausea or vomiting. (Patient not taking: Reported on 10/16/2017) 20 tablet 0    Musculoskeletal: Strength & Muscle Tone: within normal limits Gait & Station: Patient stayed in bed Patient leans: N/A  Psychiatric Specialty Exam: Physical Exam  Nursing note and vitals reviewed. Constitutional: He is oriented to person, place, and time. He appears well-developed and well-nourished.  Cardiovascular: Normal rate.  Respiratory: Effort normal.  Musculoskeletal: Normal range of motion.  Neurological: He is alert and oriented to person, place, and time.    Review of Systems  Constitutional: Negative.   HENT: Negative.   Eyes: Negative.   Respiratory: Negative.  Cardiovascular: Negative.    Gastrointestinal: Negative.   Genitourinary: Negative.   Musculoskeletal: Negative.   Skin: Negative.   Neurological: Negative.   Endo/Heme/Allergies: Negative.   Psychiatric/Behavioral: Positive for depression and substance abuse. Negative for hallucinations and suicidal ideas. The patient is not nervous/anxious and does not have insomnia.     Blood pressure 118/74, pulse 76, temperature 98.1 F (36.7 C), resp. rate 18, height '5\' 11"'  (1.803 m), weight 95.3 kg (210 lb), SpO2 100 %.Body mass index is 29.29 kg/m.  General Appearance: Casual  Eye Contact:  Good  Speech:  Clear and Coherent and Slow  Volume:  Normal but decreased at times  Mood:  Euthymic  Affect:  Flat  Thought Process:  Linear and Descriptions of Associations: Intact  Orientation:  Full (Time, Place, and Person)  Thought Content:  WDL  Suicidal Thoughts:  No  Homicidal Thoughts:  No  Memory:  Immediate;   Good Recent;   Good Remote;   Good  Judgement:  Poor  Insight:  Fair  Psychomotor Activity:  Normal  Concentration:  Concentration: Good and Attention Span: Good  Recall:  Good  Fund of Knowledge:  Good  Language:  Good  Akathisia:  No  Handed:  Right  AIMS (if indicated):     Assets:  Communication Skills Housing Resilience Social Support Transportation  ADL's:  Intact  Cognition:  WNL  Sleep:        Treatment Plan Summary: CSW to provide pt with outpatient resources  Disposition: No evidence of imminent risk to self or others at present.   Patient does not meet criteria for psychiatric inpatient admission. Supportive therapy provided about ongoing stressors. Discussed crisis plan, support from social network, calling 911, coming to the Emergency Department, and calling Suicide Hotline.  This service was provided via telemedicine using a 2-way, interactive audio and video technology.  Names of all persons participating in this telemedicine service and their role in this encounter. Name: Joe [name] Role: Patient  Name: Darnelle Maffucci Money Role: FNP-C  Name:  Role:   Name:  Role:     Lewis Shock, FNP 10/16/2017 1:20 PM   Agree with NP assessment
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
## 2019-05-12 IMAGING — CR DG WRIST COMPLETE 3+V*L*
4 series · 4 of 4 positions shown · non-contrast
Comparison: None.

CLINICAL DATA: Pain.  Syncope.

EXAM:
LEFT WRIST - COMPLETE 3+ VIEW

[wrist pa]
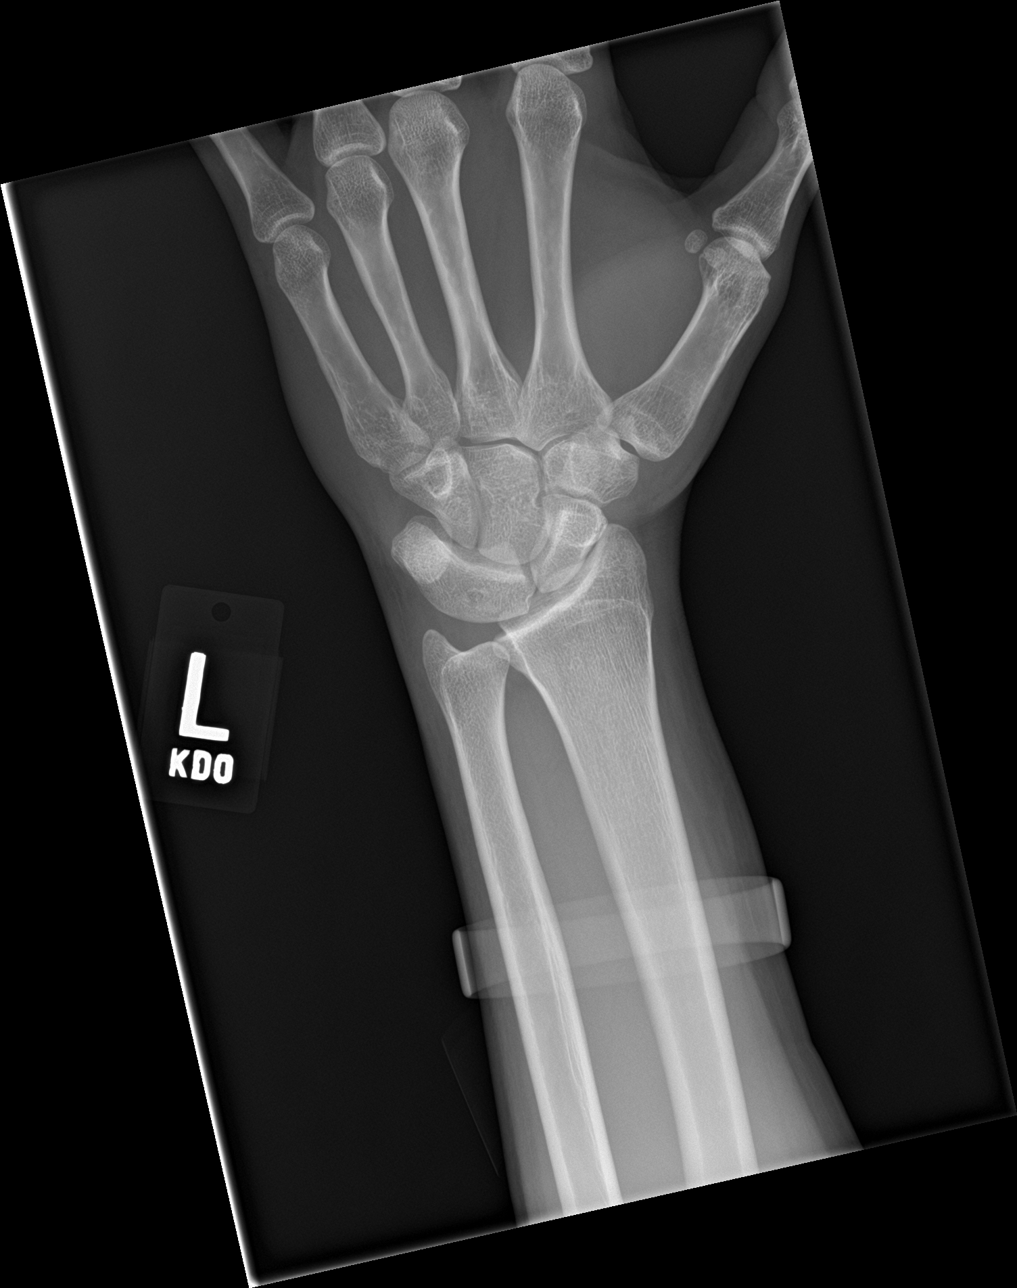

[wrist obl]
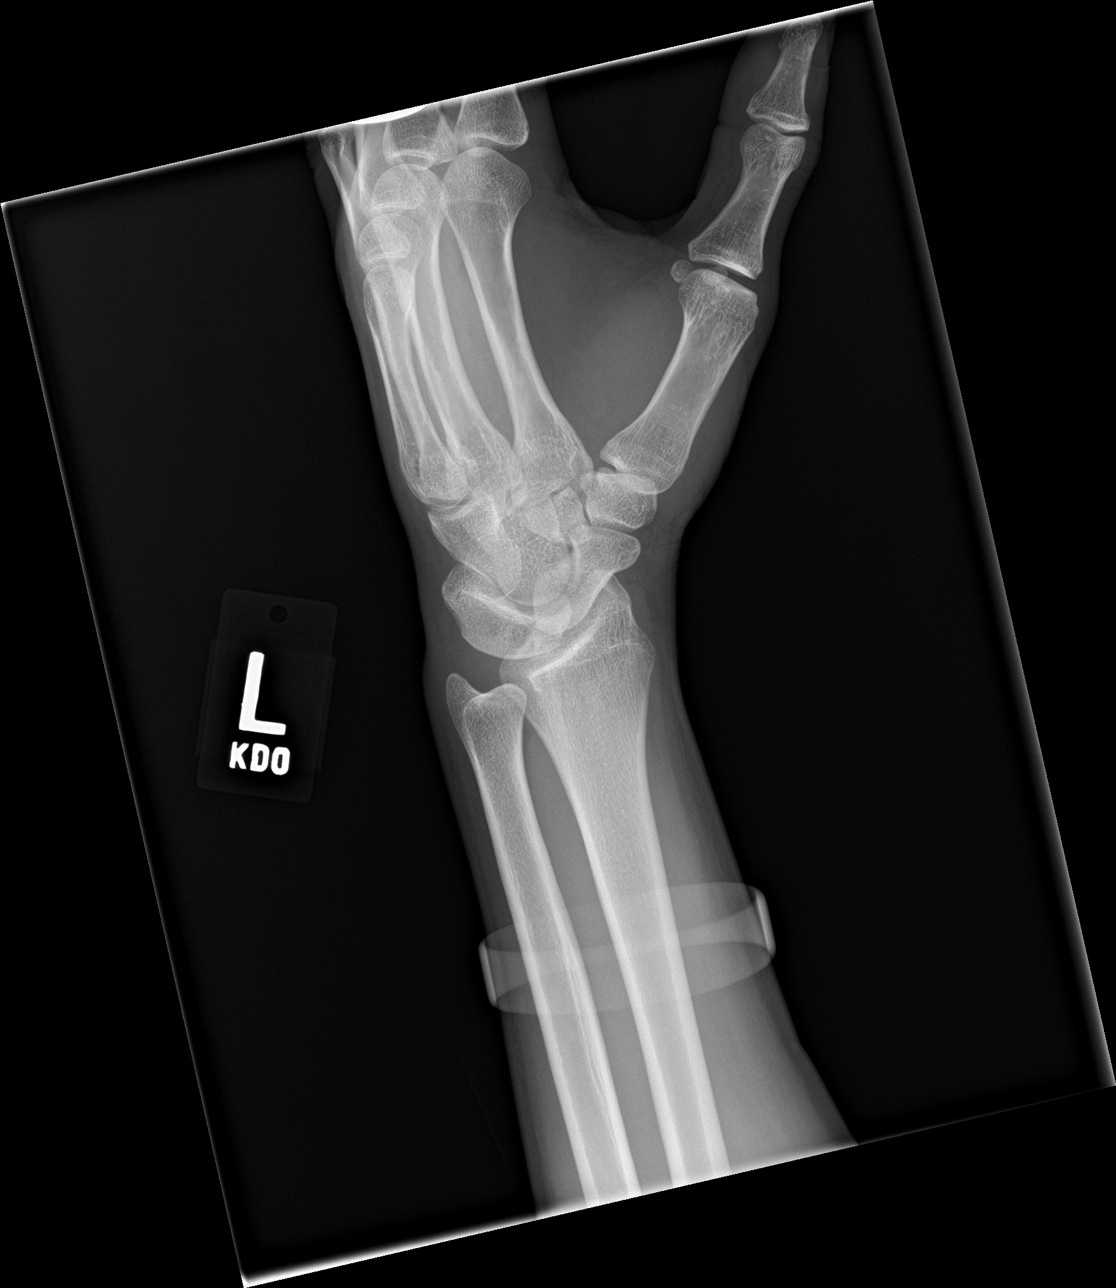

[wrist lat]
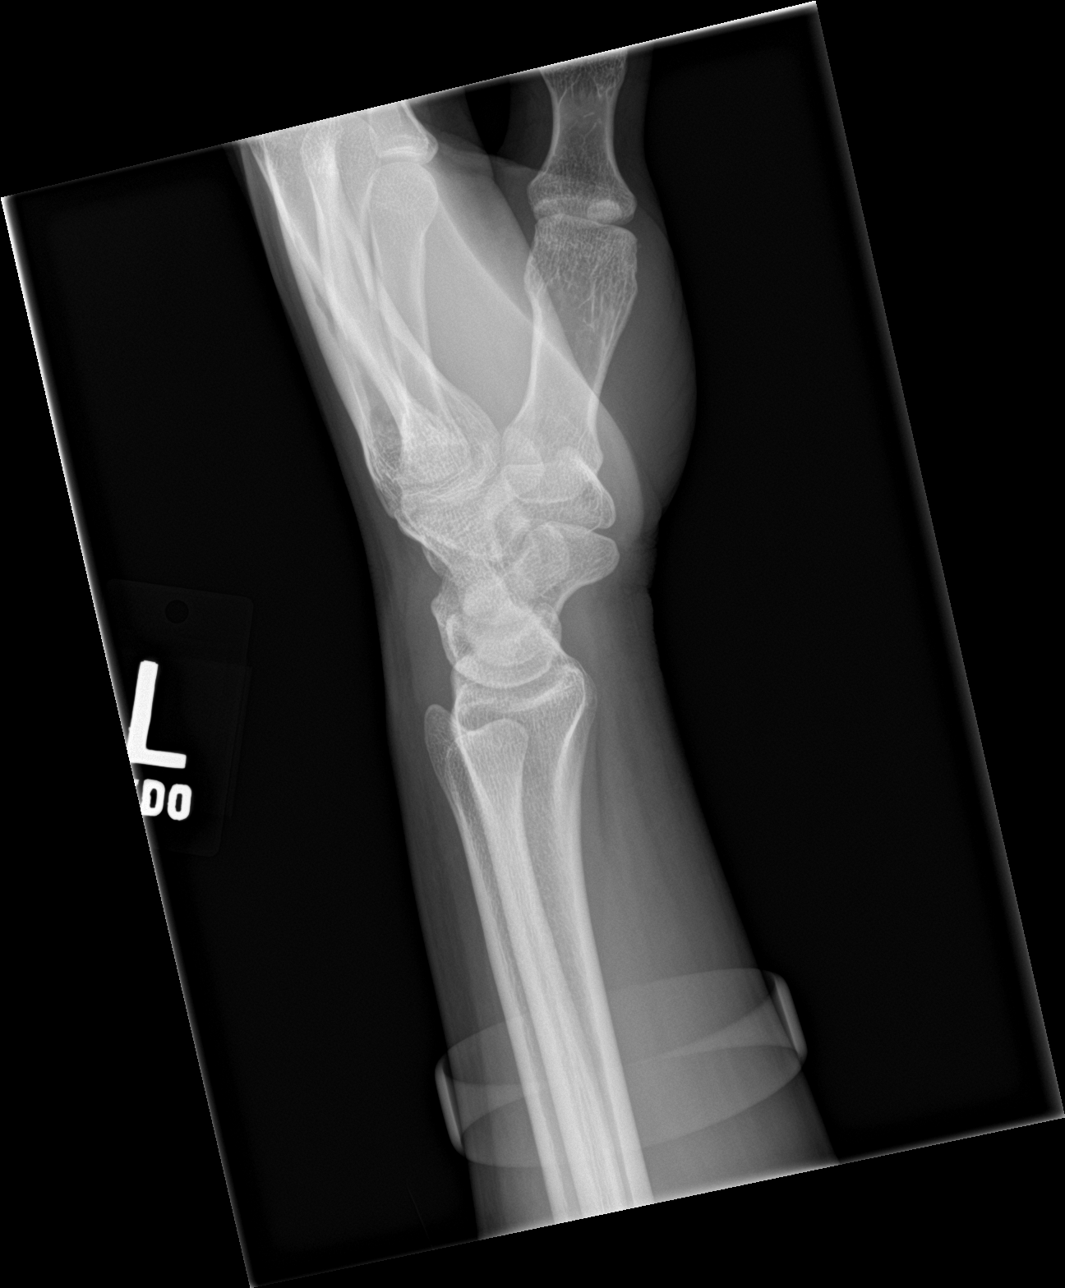

[wrist navicular]
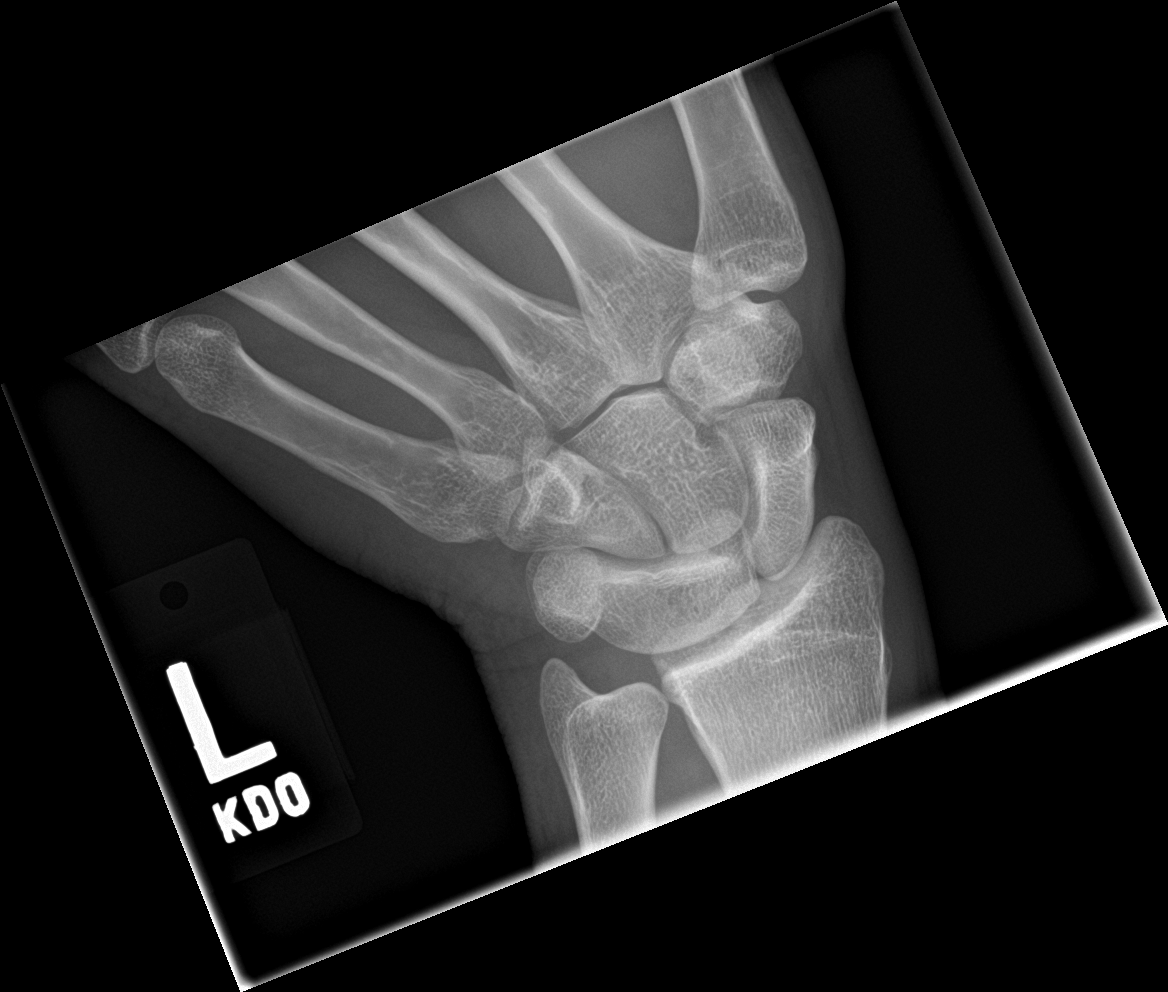

[4 of 4 positions shown; findings below may reference images not displayed]

FINDINGS: Lunotriquetral coalition. A lucency within this carpal bone is
likely a bone cysts. An erosion is less likely. No fractures
identified. No other acute abnormalities.
IMPRESSION: 1. Coalition of the lunate and triquetrum. The lucency within this
carpal bone is likely a bone cyst. An erosion is considered less
likely. No acute abnormalities seen.

## 2019-12-26 ENCOUNTER — Other Ambulatory Visit: Payer: Self-pay

## 2019-12-26 ENCOUNTER — Encounter (HOSPITAL_COMMUNITY): Payer: Self-pay

## 2019-12-26 ENCOUNTER — Emergency Department (HOSPITAL_COMMUNITY)
Admission: EM | Admit: 2019-12-26 | Discharge: 2019-12-27 | Disposition: A | Discharge status: Home / Self Care | Payer: Self-pay | Attending: Emergency Medicine | Admitting: Emergency Medicine

## 2019-12-26 DIAGNOSIS — R0781 Pleurodynia: Secondary | ICD-10-CM | POA: Insufficient documentation

## 2019-12-26 DIAGNOSIS — R05 Cough: Secondary | ICD-10-CM | POA: Insufficient documentation

## 2019-12-26 DIAGNOSIS — Z20822 Contact with and (suspected) exposure to covid-19: Secondary | ICD-10-CM | POA: Insufficient documentation

## 2019-12-26 DIAGNOSIS — R112 Nausea with vomiting, unspecified: Secondary | ICD-10-CM

## 2019-12-26 DIAGNOSIS — R053 Chronic cough: Secondary | ICD-10-CM

## 2019-12-26 DIAGNOSIS — F17210 Nicotine dependence, cigarettes, uncomplicated: Secondary | ICD-10-CM | POA: Insufficient documentation

## 2019-12-26 DIAGNOSIS — Z79899 Other long term (current) drug therapy: Secondary | ICD-10-CM | POA: Insufficient documentation

## 2019-12-26 DIAGNOSIS — J45909 Unspecified asthma, uncomplicated: Secondary | ICD-10-CM | POA: Insufficient documentation

## 2019-12-26 DIAGNOSIS — R300 Dysuria: Secondary | ICD-10-CM | POA: Insufficient documentation

## 2019-12-26 LAB — CBC
HCT: 42.4 % (ref 39.0–52.0)
Hemoglobin: 13.8 g/dL (ref 13.0–17.0)
MCH: 31.9 pg (ref 26.0–34.0)
MCHC: 32.5 g/dL (ref 30.0–36.0)
MCV: 98.1 fL (ref 80.0–100.0)
Platelets: 209 10*3/uL (ref 150–400)
RBC: 4.32 MIL/uL (ref 4.22–5.81)
RDW: 12.6 % (ref 11.5–15.5)
WBC: 5.1 10*3/uL (ref 4.0–10.5)
nRBC: 0 % (ref 0.0–0.2)

## 2019-12-26 LAB — COMPREHENSIVE METABOLIC PANEL
ALT: 20 U/L (ref 0–44)
AST: 31 U/L (ref 15–41)
Albumin: 4.2 g/dL (ref 3.5–5.0)
Alkaline Phosphatase: 47 U/L (ref 38–126)
Anion gap: 10 (ref 5–15)
BUN: 8 mg/dL (ref 6–20)
CO2: 26 mmol/L (ref 22–32)
Calcium: 9.3 mg/dL (ref 8.9–10.3)
Chloride: 107 mmol/L (ref 98–111)
Creatinine, Ser: 0.92 mg/dL (ref 0.61–1.24)
GFR calc Af Amer: >60 mL/min (ref >60)
GFR calc non Af Amer: >60 mL/min (ref >60)
Glucose, Bld: 98 mg/dL (ref 70–99)
Potassium: 3.3 mmol/L — ABNORMAL LOW (ref 3.5–5.1)
Sodium: 143 mmol/L (ref 135–145)
Total Bilirubin: 1.1 mg/dL (ref 0.3–1.2)
Total Protein: 7.2 g/dL (ref 6.5–8.1)

## 2019-12-26 LAB — URINALYSIS, ROUTINE W REFLEX MICROSCOPIC
Bilirubin Urine: NEGATIVE
Glucose, UA: NEGATIVE mg/dL
Hgb urine dipstick: NEGATIVE
Ketones, ur: NEGATIVE mg/dL
Nitrite: NEGATIVE
Protein, ur: NEGATIVE mg/dL
Specific Gravity, Urine: 1.015 (ref 1.005–1.030)
pH: 7 (ref 5.0–8.0)

## 2019-12-26 LAB — LIPASE, BLOOD: Lipase: 27 U/L (ref 11–51)

## 2019-12-26 NOTE — ED Triage Notes (Signed)
Pt has multiple complaints:  Pt c.o left arm pain since waking up this morning along with right sided abd pain and emesis x10 episodes, burning with urination

## 2019-12-26 NOTE — ED Notes (Signed)
Pt step outside 

## 2019-12-27 ENCOUNTER — Emergency Department (HOSPITAL_COMMUNITY): Payer: Self-pay

## 2019-12-27 ENCOUNTER — Encounter (HOSPITAL_COMMUNITY): Payer: Self-pay | Admitting: Emergency Medicine

## 2019-12-27 LAB — SARS CORONAVIRUS 2 BY RT PCR (HOSPITAL ORDER, PERFORMED IN ~~LOC~~ HOSPITAL LAB): SARS Coronavirus 2: NEGATIVE

## 2019-12-27 MED ORDER — AZITHROMYCIN 250 MG PO TABS
1000.0000 mg | ORAL_TABLET | Freq: Once | ORAL | Status: AC
  Administered 2019-12-27: 1000 mg via ORAL
  Filled 2019-12-27: qty 4

## 2019-12-27 MED ORDER — LIDOCAINE HCL (PF) 1 % IJ SOLN
INTRAMUSCULAR | Status: AC
  Administered 2019-12-27: 2.1 mL
  Filled 2019-12-27: qty 5

## 2019-12-27 MED ORDER — MORPHINE SULFATE (PF) 4 MG/ML IV SOLN
4.0000 mg | Freq: Once | INTRAVENOUS | Status: AC
  Administered 2019-12-27: 4 mg via INTRAVENOUS
  Filled 2019-12-27: qty 1

## 2019-12-27 MED ORDER — SODIUM CHLORIDE 0.9 % IV BOLUS
500.0000 mL | Freq: Once | INTRAVENOUS | Status: AC
  Administered 2019-12-27: 500 mL via INTRAVENOUS

## 2019-12-27 MED ORDER — CEFTRIAXONE SODIUM 500 MG IJ SOLR
500.0000 mg | Freq: Once | INTRAMUSCULAR | Status: AC
  Administered 2019-12-27: 500 mg via INTRAMUSCULAR
  Filled 2019-12-27: qty 500

## 2019-12-27 MED ORDER — ONDANSETRON 4 MG PO TBDP: 4.0000 mg | ORAL_TABLET | Freq: Three times a day (TID) | ORAL | 0 refills | Status: DC | PRN

## 2019-12-27 MED ORDER — ONDANSETRON HCL 4 MG/2ML IJ SOLN
4.0000 mg | Freq: Once | INTRAMUSCULAR | Status: AC
  Administered 2019-12-27: 4 mg via INTRAVENOUS
  Filled 2019-12-27: qty 2

## 2019-12-27 NOTE — Discharge Instructions (Addendum)
You were seen in the ER for nausea, vomiting and abdominal pain  Lab work, imaging today did not reveal a cause to her symptoms  Your symptoms may be from a virus, from something you ate or smoking marijuana  Give Zofran as needed for nausea.  Stay hydrated.  Return to the ER for persistent symptoms, fever, worsening pain, chest pain, blood in your vomit.  You also reported some discharge from the penis and burning with urination, gonorrhea and chlamydia test results are pending.  You were treated for these infection given your symptoms.  Please notify your sexual partners as they may need to get tested and treated.

## 2019-12-27 NOTE — ED Notes (Signed)
Patient verbalizes understanding of discharge instructions. Opportunity for questioning and answers were provided. Armband removed by staff, pt discharged from ED.  

## 2019-12-27 NOTE — ED Provider Notes (Addendum)
[facility] EMERGENCY DEPARTMENT Provider Note   CSN: [id number] Arrival date & time: 12/26/19  1738     History Chief Complaint  Patient presents with  . Abdominal Pain  . Arm Pain    Hoke Baer is a 29 y.o. male with history of MDD, marijuana use presents to ER with several complaints including left arm shoulder that began while in the ER waiting room "now better", right rib pain associated with nausea and vomiting x 10 since last night.  He has thrown up 5 times since arriving to ER. No chest pain, SOB. No hematemesis.  No diarrhea, constipation. No sick contacts. Last alcohol intake was 1 week ago.  Infrequent NSAID use. Also reports a cough with intermittent mucus for 3 weeks. No SOB, CP. Unvaccinated for COVID. He smokes cigarettes.  Also reports burning with urination with green penile discharge for 2 weeks. States he didn't see medical help for penile discharge because he was embarrassed. He is sexually active with male partners without condom use. History of STD last year. Denies lower abdominal pain, testicular pain, genital lesions.  No interventions. No modifying factors. No history of kidney stones, abdominal surgeries.   HPI     Past Medical History:  Diagnosis Date  . Asthma     Patient Active Problem List   Diagnosis Date Noted  . Methamphetamine abuse (HCC) 10/16/2017  . MDD (major depressive disorder), severe (HCC) 10/16/2017    History reviewed. No pertinent surgical history.     History reviewed. No pertinent family history.  Social History   Tobacco Use  . Smoking status: Current Every Day Smoker    Packs/day: 0.50    Types: Cigarettes  . Smokeless tobacco: Never Used  Substance Use Topics  . Alcohol use: Yes    Comment: occ  . Drug use: Yes    Types: Marijuana    Comment: occ    Home Medications Prior to Admission medications   Medication Sig Start Date End Date Taking? Authorizing Provider  naproxen (NAPROSYN) 500 MG  tablet Take 1 tablet (500 mg total) by mouth 2 (two) times daily as needed for mild pain or moderate pain. Patient not taking: Reported on 10/16/2017 04/19/16   Trixie Dredge, PA-C  ondansetron (ZOFRAN ODT) 4 MG disintegrating tablet Take 1 tablet (4 mg total) by mouth every 8 (eight) hours as needed for nausea or vomiting. 12/27/19   Liberty Handy, PA-C    Allergies    Patient has no known allergies.  Review of Systems   Review of Systems  Respiratory: Positive for cough (x 3 weeks).   Cardiovascular: Positive for chest pain (right rib pain).  Gastrointestinal: Positive for nausea and vomiting.  Genitourinary: Positive for dysuria.  Musculoskeletal: Positive for arthralgias.  All other systems reviewed and are negative.   Physical Exam Updated Vital Signs BP (!) 149/81   Pulse 60   Temp 98.4 F (36.9 C) (Oral)   Resp 16   SpO2 98%   Physical Exam Vitals and nursing note reviewed.  Constitutional:      Appearance: He is well-developed.     Comments: Non toxic.  HENT:     Head: Normocephalic and atraumatic.     Nose: Nose normal.  Eyes:     Conjunctiva/sclera: Conjunctivae normal.  Cardiovascular:     Rate and Rhythm: Normal rate and regular rhythm.     Heart sounds: Normal heart sounds.  Pulmonary:     Effort: Pulmonary effort is normal.  Breath sounds: Normal breath sounds.     Comments: No rib tenderness  Abdominal:     General: Bowel sounds are normal.     Palpations: Abdomen is soft.     Tenderness: There is abdominal tenderness (RUQ).     Comments: Soft. No G/R/R. No suprapubic or CVA tenderness. Negative Murphy's and McBurney's  Musculoskeletal:        General: Normal range of motion.     Cervical back: Normal range of motion.  Skin:    General: Skin is warm and dry.     Capillary Refill: Capillary refill takes less than 2 seconds.  Neurological:     Mental Status: He is alert.  Psychiatric:        Behavior: Behavior normal.     ED Results /  Procedures / Treatments   Labs (all labs ordered are listed, but only abnormal results are displayed) Labs Reviewed  COMPREHENSIVE METABOLIC PANEL - Abnormal; Notable for the following components:      Result Value   Potassium 3.3 (*)    All other components within normal limits  URINALYSIS, ROUTINE W REFLEX MICROSCOPIC - Abnormal; Notable for the following components:   Leukocytes,Ua TRACE (*)    Bacteria, UA RARE (*)    All other components within normal limits  SARS CORONAVIRUS 2 BY RT PCR (HOSPITAL ORDER, PERFORMED IN [facility])  URINE CULTURE  LIPASE, BLOOD  CBC  GC/CHLAMYDIA PROBE AMP () NOT AT [facility]    EKG None  Radiology DG Chest 2 View  Result Date: 12/27/2019 CLINICAL DATA:  Rib pain, left arm pain, emesis EXAM: CHEST - 2 VIEW COMPARISON:  10/15/2017 FINDINGS: The heart size and mediastinal contours are within normal limits. Both lungs are clear. The visualized skeletal structures are unremarkable. IMPRESSION: No active cardiopulmonary disease. Electronically Signed   By: Duanne Guess D.O.   On: 12/27/2019 08:04   US Abdomen Complete  Result Date: 12/27/2019 CLINICAL DATA:  Right upper quadrant tenderness. EXAM: ABDOMEN ULTRASOUND COMPLETE COMPARISON:  None. FINDINGS: Gallbladder: No gallstones or wall thickening visualized. No sonographic Murphy sign noted by sonographer. Common bile duct: Diameter: 3 mm Liver: No focal lesion identified. Within normal limits in parenchymal echogenicity. Portal vein is patent on color Doppler imaging with normal direction of blood flow towards the liver. IVC: No abnormality visualized. Pancreas: Visualized portion unremarkable. Spleen: Size and appearance within normal limits. Right Kidney: Length: 11 cm. Echogenicity within normal limits. No mass or hydronephrosis visualized. Left Kidney: Length: 11 cm. Echogenicity within normal limits. No solid mass or hydronephrosis visualized. Simple 13 mm cyst. Abdominal aorta:  No aneurysm visualized. IMPRESSION: Unremarkable abdominal ultrasound. Electronically Signed   By: Marnee Spring M.D.   On: 12/27/2019 07:44    Procedures Procedures (including critical care time)  Medications Ordered in ED Medications  morphine 4 MG/ML injection 4 mg (4 mg Intravenous Given 12/27/19 0707)  ondansetron (ZOFRAN) injection 4 mg (4 mg Intravenous Given 12/27/19 0707)  cefTRIAXone (ROCEPHIN) injection 500 mg (500 mg Intramuscular Given 12/27/19 0708)  azithromycin (ZITHROMAX) tablet 1,000 mg (1,000 mg Oral Given 12/27/19 0706)  lidocaine (PF) (XYLOCAINE) 1 % injection (2.1 mLs  Given 12/27/19 0709)  sodium chloride 0.9 % bolus 500 mL (500 mLs Intravenous New Bag/Given 12/27/19 4431)    ED Course  I have reviewed the triage vital signs and the nursing notes.  Pertinent labs & imaging results that were available during my care of the patient were reviewed by me and  considered in my medical decision making (see chart for details).  Clinical Course as of Dec 27 914  Wed Dec 27, 2019  5462 Mucus: PRESENT [CG]  0641 Bacteria, UA(!): RARE [CG]  0641 WBC, UA: 6-10 [CG]  0641 Leukocytes,Ua(!): TRACE [CG]    Clinical Course User Index [CG] Liberty Handy, PA-C   MDM Rules/Calculators/A&P                           29 y.o. yo presents for right "rib pain".  Exam reveals focal RUQ tenderness.  Also reports dysuria and penile discharge. Of note, GU exam deferred as patient was in a hall bed and no room available.  He denies genital lesions, testicular pain.   Also reports cough for 3 weeks.  I obtained additional history from triage, nursing notes and review of medical chart.  Previous medical records available, nursing notes reviewed to obtain more history and assist with MDM  Differential diagnosis includes URI vs PNA vs COVID vs bronchitis or cough related to cigarette use.  Penile discharge with sexual history concerning for possible STD.  He has RUQ abdominal tenderness with  nausea and vomiting, concern for GB process vs viral gastroenteritis, gastritis, cannabinoid hyperemesis syndrome. No history of kidney stones.  His rib pain appears to be RUQ abdomina pain.  No respiratory symptoms. PERC negative.   Laboratory studies and imaging ordered in triage RN and I ordered additional laboratory and imaging studies including abdominal labs, UA.    I have personally visualized and interpreted labs and imaging.    ER work up thus far reveals normal WBC, LFT, lipase and creatinine. UA with mucus, rare bacteria, trace leukocytes and 6-10 WBC.  Suspect this is related to penile discharge, less likely UTI/pyelonephritis. No RBC, renal stone less likely.   I have ordered lab work and imaging studies including complete abdominal US, EKG, CXR.   I have ordered medicines including recephin, azithromycin, morphine, zofran, IVF.  Will plan for serial re-examinations. Close monitoring.   0915: Patient reevaluated.  Reports complete improvement in pain.  No vomiting.  Tolerating p.o.  Repeat abdominal exam significantly improved.  Remaining work-up personally visualized and interpreted.  Abdominal ultrasound reveals normal gallbladder, right kidney without any hydronephrosis or signs of obstruction, stranding or stones.  Suspect his urinary symptoms are secondary to an STD.  Urine was sent for culture.  Chest x-ray is nonacute.  EKG with sinus bradycardia rate in the 40s however patient's pulse has been rechecked multiple times and he has not had significant bradycardia.  He has no chest pain, lightheadedness or any other symptoms.  We will discharge with Zofran, oral hydration.  Return precautions discussed.  He is comfortable with this plan.  Pending Covid test at discharge.  Final Clinical Impression(s) / ED Diagnoses Final diagnoses:  Nausea and vomiting in adult    Rx / DC Orders ED Discharge Orders         Ordered    ondansetron (ZOFRAN ODT) 4 MG disintegrating tablet   Every 8 hours PRN        12/27/19 0910           Liberty Handy, PA-C 12/27/19 0916    Liberty Handy, PA-C 12/27/19 7035    Raeford Razor, MD 12/30/19 1546

## 2019-12-28 LAB — URINE CULTURE: Culture: <10000 — AB

## 2021-07-14 ENCOUNTER — Emergency Department (HOSPITAL_COMMUNITY)
Admission: EM | Admit: 2021-07-14 | Discharge: 2021-07-14 | Disposition: A | Discharge status: Home / Self Care | Payer: PRIVATE HEALTH INSURANCE | Attending: Emergency Medicine | Admitting: Emergency Medicine

## 2021-07-14 ENCOUNTER — Encounter (HOSPITAL_COMMUNITY): Payer: Self-pay

## 2021-07-14 ENCOUNTER — Emergency Department (HOSPITAL_COMMUNITY): Payer: PRIVATE HEALTH INSURANCE

## 2021-07-14 ENCOUNTER — Other Ambulatory Visit: Payer: Self-pay

## 2021-07-14 DIAGNOSIS — S29002A Unspecified injury of muscle and tendon of back wall of thorax, initial encounter: Secondary | ICD-10-CM | POA: Diagnosis not present

## 2021-07-14 DIAGNOSIS — T148XXA Other injury of unspecified body region, initial encounter: Secondary | ICD-10-CM

## 2021-07-14 DIAGNOSIS — M25561 Pain in right knee: Secondary | ICD-10-CM | POA: Insufficient documentation

## 2021-07-14 DIAGNOSIS — M25571 Pain in right ankle and joints of right foot: Secondary | ICD-10-CM

## 2021-07-14 DIAGNOSIS — Y92410 Unspecified street and highway as the place of occurrence of the external cause: Secondary | ICD-10-CM | POA: Insufficient documentation

## 2021-07-14 DIAGNOSIS — S99911A Unspecified injury of right ankle, initial encounter: Secondary | ICD-10-CM | POA: Diagnosis not present

## 2021-07-14 DIAGNOSIS — S8991XA Unspecified injury of right lower leg, initial encounter: Secondary | ICD-10-CM | POA: Diagnosis present

## 2021-07-14 DIAGNOSIS — M546 Pain in thoracic spine: Secondary | ICD-10-CM

## 2021-07-14 MED ORDER — NAPROXEN 500 MG PO TABS: 500.0000 mg | ORAL_TABLET | Freq: Two times a day (BID) | ORAL | 0 refills | Status: DC

## 2021-07-14 MED ORDER — METHOCARBAMOL 500 MG PO TABS: 500.0000 mg | ORAL_TABLET | Freq: Two times a day (BID) | ORAL | 0 refills | Status: DC

## 2021-07-14 NOTE — ED Notes (Signed)
I provided reinforced discharge education based off of discharge instructions. Pt acknowledged and understood my education. Pt had no further questions/concerns for provider/myself.  °
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
## 2021-07-14 NOTE — ED Notes (Signed)
Pt ambulatory without assistance.  

## 2021-07-14 NOTE — Discharge Instructions (Addendum)
Your ankle xray showed concerns for an avulsion injury. Please wear the ankle boot and follow up with Dr. Doreatha Martin orthopedist for further evaluation.  ? ?While at home please rest, ice, and elevate your ankle to help reduce pain/inflammation.  ? ?The remainder of your xrays were negative.  ? ?Pick up medication and take as prescribed. DO NOT DRIVE OR DRINK ALCOHOL WHILE TAKING THE MUSCLE RELAXER AS IT CAN MAKE YOU DROWSY. I would also not recommend smoking marijuana while taking the muscle relaxer.  ? ?Follow up with your PCP for further evaluation ? ?Return to the ED for any new/worsening symptoms ?
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
## 2021-09-16 ENCOUNTER — Emergency Department (HOSPITAL_COMMUNITY)
Admission: EM | Admit: 2021-09-16 | Discharge: 2021-09-16 | Disposition: A | Discharge status: Home / Self Care | Payer: PRIVATE HEALTH INSURANCE | Attending: Emergency Medicine | Admitting: Emergency Medicine

## 2021-09-16 ENCOUNTER — Emergency Department (HOSPITAL_COMMUNITY): Payer: PRIVATE HEALTH INSURANCE

## 2021-09-16 ENCOUNTER — Other Ambulatory Visit: Payer: Self-pay

## 2021-09-16 DIAGNOSIS — M25512 Pain in left shoulder: Secondary | ICD-10-CM | POA: Diagnosis present

## 2021-09-16 NOTE — Discharge Instructions (Signed)
You were seen today for left shoulder pain.  X-ray showed no fracture or dislocation.  I recommend follow-up with orthopedics.  I have provided contact information for the office.  I have had you placed in a sling for comfort.  You may take this off as needed for bathing or for comfort.
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
## 2021-09-16 NOTE — ED Triage Notes (Signed)
Pt states he is having left shoulder pain. States this pain started 4/26 when he was moving furniture. States the pain is getting worse.
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
## 2021-09-18 ENCOUNTER — Ambulatory Visit: Payer: No Typology Code available for payment source | Attending: Physician Assistant

## 2021-09-18 ENCOUNTER — Other Ambulatory Visit: Payer: Self-pay

## 2021-09-18 DIAGNOSIS — M25571 Pain in right ankle and joints of right foot: Secondary | ICD-10-CM | POA: Diagnosis present

## 2021-09-18 DIAGNOSIS — M6281 Muscle weakness (generalized): Secondary | ICD-10-CM

## 2021-09-18 DIAGNOSIS — R262 Difficulty in walking, not elsewhere classified: Secondary | ICD-10-CM

## 2021-09-18 DIAGNOSIS — M25572 Pain in left ankle and joints of left foot: Secondary | ICD-10-CM | POA: Insufficient documentation

## 2021-09-24 ENCOUNTER — Ambulatory Visit: Payer: No Typology Code available for payment source

## 2021-09-24 DIAGNOSIS — M25571 Pain in right ankle and joints of right foot: Secondary | ICD-10-CM | POA: Diagnosis not present

## 2021-09-24 DIAGNOSIS — M6281 Muscle weakness (generalized): Secondary | ICD-10-CM

## 2021-09-24 DIAGNOSIS — R262 Difficulty in walking, not elsewhere classified: Secondary | ICD-10-CM
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
## 2021-09-27 ENCOUNTER — Ambulatory Visit: Payer: No Typology Code available for payment source
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
## 2021-09-30 ENCOUNTER — Telehealth: Payer: Self-pay

## 2021-09-30 ENCOUNTER — Ambulatory Visit: Payer: No Typology Code available for payment source
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
## 2021-09-30 NOTE — Telephone Encounter (Signed)
Spoke with pt regarding his first no-show. Discussed clinic's attendance policy and confirmed next remaining appointment.

## 2021-10-02 ENCOUNTER — Encounter: Payer: Self-pay | Admitting: Physical Therapy

## 2021-10-02 ENCOUNTER — Ambulatory Visit: Payer: No Typology Code available for payment source | Admitting: Physical Therapy

## 2021-10-02 DIAGNOSIS — M25571 Pain in right ankle and joints of right foot: Secondary | ICD-10-CM | POA: Diagnosis not present

## 2021-10-02 DIAGNOSIS — M6281 Muscle weakness (generalized): Secondary | ICD-10-CM

## 2021-10-02 DIAGNOSIS — R262 Difficulty in walking, not elsewhere classified: Secondary | ICD-10-CM
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
## 2021-10-07 ENCOUNTER — Encounter: Payer: Self-pay | Admitting: Physical Therapy

## 2021-10-07 ENCOUNTER — Ambulatory Visit: Payer: No Typology Code available for payment source | Admitting: Physical Therapy

## 2021-10-07 DIAGNOSIS — M25571 Pain in right ankle and joints of right foot: Secondary | ICD-10-CM

## 2021-10-07 DIAGNOSIS — M6281 Muscle weakness (generalized): Secondary | ICD-10-CM

## 2021-10-07 DIAGNOSIS — R262 Difficulty in walking, not elsewhere classified: Secondary | ICD-10-CM
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
## 2021-10-09 ENCOUNTER — Ambulatory Visit: Payer: No Typology Code available for payment source

## 2021-10-09 DIAGNOSIS — M25571 Pain in right ankle and joints of right foot: Secondary | ICD-10-CM

## 2021-10-09 DIAGNOSIS — M6281 Muscle weakness (generalized): Secondary | ICD-10-CM

## 2021-10-09 DIAGNOSIS — R262 Difficulty in walking, not elsewhere classified: Secondary | ICD-10-CM
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
## 2021-10-14 ENCOUNTER — Ambulatory Visit: Payer: No Typology Code available for payment source

## 2021-10-14 DIAGNOSIS — M6281 Muscle weakness (generalized): Secondary | ICD-10-CM

## 2021-10-14 DIAGNOSIS — R262 Difficulty in walking, not elsewhere classified: Secondary | ICD-10-CM

## 2021-10-14 DIAGNOSIS — M25571 Pain in right ankle and joints of right foot: Secondary | ICD-10-CM | POA: Diagnosis not present
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
## 2021-10-16 ENCOUNTER — Emergency Department (HOSPITAL_COMMUNITY)
Admission: EM | Admit: 2021-10-16 | Discharge: 2021-10-16 | Disposition: A | Discharge status: Home / Self Care | Payer: PRIVATE HEALTH INSURANCE | Attending: Emergency Medicine | Admitting: Emergency Medicine

## 2021-10-16 ENCOUNTER — Ambulatory Visit: Payer: No Typology Code available for payment source

## 2021-10-16 ENCOUNTER — Emergency Department (HOSPITAL_COMMUNITY): Payer: PRIVATE HEALTH INSURANCE

## 2021-10-16 DIAGNOSIS — R0602 Shortness of breath: Secondary | ICD-10-CM | POA: Insufficient documentation

## 2021-10-16 DIAGNOSIS — R079 Chest pain, unspecified: Secondary | ICD-10-CM | POA: Insufficient documentation

## 2021-10-16 DIAGNOSIS — Z8709 Personal history of other diseases of the respiratory system: Secondary | ICD-10-CM

## 2021-10-16 DIAGNOSIS — R112 Nausea with vomiting, unspecified: Secondary | ICD-10-CM | POA: Insufficient documentation

## 2021-10-16 DIAGNOSIS — J45909 Unspecified asthma, uncomplicated: Secondary | ICD-10-CM | POA: Diagnosis not present

## 2021-10-16 LAB — BASIC METABOLIC PANEL
Anion gap: 13 (ref 5–15)
BUN: 13 mg/dL (ref 6–20)
CO2: 19 mmol/L — ABNORMAL LOW (ref 22–32)
Calcium: 9.5 mg/dL (ref 8.9–10.3)
Chloride: 108 mmol/L (ref 98–111)
Creatinine, Ser: 1.21 mg/dL (ref 0.61–1.24)
GFR, Estimated: >60 mL/min (ref >60)
Glucose, Bld: 73 mg/dL (ref 70–99)
Potassium: 3.9 mmol/L (ref 3.5–5.1)
Sodium: 140 mmol/L (ref 135–145)

## 2021-10-16 LAB — CBC
HCT: 42.9 % (ref 39.0–52.0)
Hemoglobin: 14.7 g/dL (ref 13.0–17.0)
MCH: 31.4 pg (ref 26.0–34.0)
MCHC: 34.3 g/dL (ref 30.0–36.0)
MCV: 91.7 fL (ref 80.0–100.0)
Platelets: 258 10*3/uL (ref 150–400)
RBC: 4.68 MIL/uL (ref 4.22–5.81)
RDW: 12.3 % (ref 11.5–15.5)
WBC: 6 10*3/uL (ref 4.0–10.5)
nRBC: 0 % (ref 0.0–0.2)

## 2021-10-16 LAB — TROPONIN I (HIGH SENSITIVITY): Troponin I (High Sensitivity): 3 ng/L (ref <18)

## 2021-10-16 MED ORDER — ALBUTEROL SULFATE HFA 108 (90 BASE) MCG/ACT IN AERS
1.0000 | INHALATION_SPRAY | Freq: Once | RESPIRATORY_TRACT | Status: DC
  Filled 2021-10-16: qty 6.7

## 2021-10-16 NOTE — ED Provider Notes (Signed)
[facility] EMERGENCY DEPARTMENT Provider Note   CSN: [id number] Arrival date & time: 10/16/21  1023     History  Chief Complaint  Patient presents with   Chest Pain    Joe [name] is a 31 y.o. male who presents the emergency department concern of chest pain.  Patient states his symptoms started around 5 AM this morning, and he had 1 episode of emesis.  Reports a history of asthma, and states that he has been slightly more short of breath today.  Has an inhaler at home, but believes that it is run out.  States his central chest pain gets worse with deep breaths.  EMS noted that they were on scene with another patient, when this patient approached them requesting assistance.  EMS reported EKG was unremarkable, and gave aspirin.  History of bipolar disorder and schizophrenia.   Chest Pain Associated symptoms: nausea, shortness of breath and vomiting   Associated symptoms: no abdominal pain, no cough, no fever and no palpitations        Home Medications Prior to Admission medications   Medication Sig Start Date End Date Taking? Authorizing Provider  methocarbamol (ROBAXIN) 500 MG tablet Take 1 tablet (500 mg total) by mouth 2 (two) times daily. 07/14/21   Alroy Bailiff, Margaux, PA-C  naproxen (NAPROSYN) 500 MG tablet Take 1 tablet (500 mg total) by mouth 2 (two) times daily as needed for mild pain or moderate pain. Patient not taking: Reported on 10/16/2017 04/19/16   Clayton Bibles, PA-C  naproxen (NAPROSYN) 500 MG tablet Take 1 tablet (500 mg total) by mouth 2 (two) times daily. 07/14/21   Alroy Bailiff, Margaux, PA-C  ondansetron (ZOFRAN ODT) 4 MG disintegrating tablet Take 1 tablet (4 mg total) by mouth every 8 (eight) hours as needed for nausea or vomiting. Patient not taking: Reported on 09/18/2021 12/27/19   Kinnie Feil, PA-C      Allergies    Patient has no known allergies.    Review of Systems   Review of Systems  Constitutional:  Negative for fever.  Respiratory:   Positive for shortness of breath. Negative for cough.   Cardiovascular:  Positive for chest pain. Negative for palpitations and leg swelling.  Gastrointestinal:  Positive for nausea and vomiting. Negative for abdominal pain and constipation.  All other systems reviewed and are negative.   Physical Exam Updated Vital Signs BP 125/77   Pulse 74   Temp 97.8 F (36.6 C) (Oral)   Resp 12   Ht 6' (1.829 m)   Wt 88 kg   SpO2 95%   BMI 26.31 kg/m  Physical Exam Vitals and nursing note reviewed.  Constitutional:      Appearance: Normal appearance.  HENT:     Head: Normocephalic and atraumatic.  Eyes:     Conjunctiva/sclera: Conjunctivae normal.  Cardiovascular:     Rate and Rhythm: Normal rate and regular rhythm.     Comments: Reproducible chest pain to palpation of central chest Pulmonary:     Effort: Pulmonary effort is normal. No respiratory distress.     Breath sounds: Normal breath sounds.  Abdominal:     General: There is no distension.     Palpations: Abdomen is soft.     Tenderness: There is no abdominal tenderness.  Skin:    General: Skin is warm and dry.  Neurological:     General: No focal deficit present.     Mental Status: He is alert.     ED Results /  Procedures / Treatments   Labs (all labs ordered are listed, but only abnormal results are displayed) Labs Reviewed  BASIC METABOLIC PANEL - Abnormal; Notable for the following components:      Result Value   CO2 19 (*)    All other components within normal limits  CBC  TROPONIN I (HIGH SENSITIVITY)    EKG None  Radiology DG Chest 2 View  Result Date: 10/16/2021 CLINICAL DATA:  Chest pain and vomiting EXAM: CHEST - 2 VIEW COMPARISON:  Chest radiograph 12/27/2019 FINDINGS: The cardiomediastinal silhouette is normal. There is no focal consolidation or pulmonary edema. There is no pleural effusion or pneumothorax There is no acute osseous abnormality. IMPRESSION: No radiographic evidence of acute  cardiopulmonary process. Electronically Signed   By: Lesia Hausen M.D.   On: 10/16/2021 11:10    Procedures Procedures    Medications Ordered in ED Medications  albuterol (VENTOLIN HFA) 108 (90 Base) MCG/ACT inhaler 1 puff (has no administration in time range)    ED Course/ Medical Decision Making/ A&P                           Medical Decision Making Amount and/or Complexity of Data Reviewed Labs: ordered. Radiology: ordered.  This patient is a 31 y.o. male  who presents to the ED for concern of chest pain.   Differential diagnoses prior to evaluation: The emergent differential diagnosis includes, but is not limited to,  ACS, pericarditis, aortic dissection, PE, pneumothorax, esophageal spasm or rupture, chronic angina, valvular disease, cardiomyopathy, myocarditis, pulmonary HTN, pneumonia, bronchitis, GERD, reflux/PUD, biliary disease, pancreatitis, disk disease, costochondritis, anxiety or panic attack. This is not an exhaustive differential.   Past Medical History / Co-morbidities: Asthma, bipolar disorder, schizophrenia, methamphetamine abuse, depression  Physical Exam: Physical exam performed. The pertinent findings include: Hypertensive, otherwise normal vital signs.  Reproducible tenderness palpation of central chest. Does not appear in any distress.   Lab Tests/Imaging studies: I personally interpreted labs/imaging and the pertinent results include: No leukocytosis, normal hemoglobin.  Electrolytes within normal limits, normal kidney function.  Troponin of 3.  X-ray with no acute cardiopulmonary abnormalities. I agree with the radiologist interpretation.   Medications: Although patient is on any wheezing, does have a history of asthma and reports some chest tightness so I ordered an inhaler for him.  I have reviewed the patients home medicines and have made adjustments as needed.   Disposition: After consideration of the diagnostic results and the patients response to  treatment, I feel that Patient is to be discharged with recommendation to follow up with PCP in regards to today's hospital visit. Chest pain is not likely of cardiac or pulmonary etiology d/t presentation, PERC for PE negative, VSS, no tracheal deviation, no JVD or new murmur, RRR, breath sounds equal bilaterally, EKG without acute abnormalities, negative troponin, and negative CXR. Heart score of 2. Pt has been advised to return to the ED if CP becomes exertional, associated with diaphoresis or nausea, radiates to left jaw/arm, worsens or becomes concerning in any way. Pt appears reliable for follow up and is agreeable to discharge.   I discussed this case with my attending physician Dr. Rubin Payor who cosigned this note including patient's presenting symptoms, physical exam, and planned diagnostics and interventions. Attending physician stated agreement with plan or made changes to plan which were implemented.  Final Clinical Impression(s) / ED Diagnoses Final diagnoses:  Nonspecific chest pain  History of asthma  Rx / DC Orders ED Discharge Orders     None      Portions of this report may have been transcribed using voice recognition software. Every effort was made to ensure accuracy; however, inadvertent computerized transcription errors may be present.    Jeanella Flattery 10/16/21 1300    Benjiman Core, MD 10/16/21 1616

## 2021-10-16 NOTE — ED Triage Notes (Signed)
EMS on scene with another pt when this patient approached them stating he was having Chest pain starting this morning with vomiting. Mental health hx of bipolar, schizo. Pain with inspiration. EKG unremarkable. EMS gave aspirin. Reproducible pain.

## 2021-10-16 NOTE — Discharge Instructions (Addendum)
You were seen in the emergency department today for chest pain.  As we discussed your lab work, EKG, chest x-ray all looked reassuring today.  I think that your symptoms could likely been related to your asthma.  I have given you an inhaler to take with you.    I recommend monitoring your stress levels.  Continue to monitor how you are doing overall, and return to the emergency department for any new or worsening symptoms such as: Worsening pain or pain with exertion, difficulty breathing, sweating, or pain or swelling in your legs.

## 2021-10-16 NOTE — ED Notes (Signed)
Patient transported to CT 

## 2021-10-22 ENCOUNTER — Ambulatory Visit: Payer: No Typology Code available for payment source | Attending: Physician Assistant

## 2021-10-22 DIAGNOSIS — M25571 Pain in right ankle and joints of right foot: Secondary | ICD-10-CM | POA: Insufficient documentation

## 2021-10-22 DIAGNOSIS — M6281 Muscle weakness (generalized): Secondary | ICD-10-CM | POA: Diagnosis present

## 2021-10-22 DIAGNOSIS — R262 Difficulty in walking, not elsewhere classified: Secondary | ICD-10-CM | POA: Insufficient documentation
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
## 2021-10-29 ENCOUNTER — Ambulatory Visit: Payer: No Typology Code available for payment source

## 2021-10-29 DIAGNOSIS — M25571 Pain in right ankle and joints of right foot: Secondary | ICD-10-CM | POA: Diagnosis not present

## 2021-10-29 DIAGNOSIS — M6281 Muscle weakness (generalized): Secondary | ICD-10-CM

## 2021-10-29 DIAGNOSIS — R262 Difficulty in walking, not elsewhere classified: Secondary | ICD-10-CM
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
## 2021-11-05 ENCOUNTER — Ambulatory Visit: Payer: No Typology Code available for payment source | Admitting: Physical Therapy

## 2021-11-05 ENCOUNTER — Telehealth: Payer: Self-pay | Admitting: Physical Therapy

## 2021-11-05 NOTE — Telephone Encounter (Signed)
Called and informed patient of missed visit and provided reminder of next appt and attendance policy.  

## 2021-11-12 ENCOUNTER — Ambulatory Visit: Payer: No Typology Code available for payment source | Admitting: Physical Therapy

## 2021-11-12 ENCOUNTER — Encounter: Payer: Self-pay | Admitting: Physical Therapy

## 2021-11-12 DIAGNOSIS — M6281 Muscle weakness (generalized): Secondary | ICD-10-CM

## 2021-11-12 DIAGNOSIS — M25571 Pain in right ankle and joints of right foot: Secondary | ICD-10-CM

## 2021-11-12 DIAGNOSIS — R262 Difficulty in walking, not elsewhere classified: Secondary | ICD-10-CM
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
## 2021-11-19 ENCOUNTER — Ambulatory Visit: Payer: No Typology Code available for payment source

## 2023-02-23 ENCOUNTER — Emergency Department (HOSPITAL_COMMUNITY)
Admission: EM | Admit: 2023-02-23 | Discharge: 2023-02-24 | Disposition: A | Discharge status: Home / Self Care | Payer: No Typology Code available for payment source | Attending: Emergency Medicine | Admitting: Emergency Medicine

## 2023-02-23 ENCOUNTER — Other Ambulatory Visit: Payer: Self-pay

## 2023-02-23 ENCOUNTER — Emergency Department (HOSPITAL_COMMUNITY): Payer: No Typology Code available for payment source

## 2023-02-23 ENCOUNTER — Encounter (HOSPITAL_COMMUNITY): Payer: Self-pay

## 2023-02-23 DIAGNOSIS — R45851 Suicidal ideations: Secondary | ICD-10-CM | POA: Insufficient documentation

## 2023-02-23 DIAGNOSIS — G8929 Other chronic pain: Secondary | ICD-10-CM | POA: Diagnosis not present

## 2023-02-23 DIAGNOSIS — F322 Major depressive disorder, single episode, severe without psychotic features: Secondary | ICD-10-CM | POA: Diagnosis present

## 2023-02-23 DIAGNOSIS — M545 Low back pain, unspecified: Secondary | ICD-10-CM | POA: Diagnosis present

## 2023-02-23 DIAGNOSIS — J45909 Unspecified asthma, uncomplicated: Secondary | ICD-10-CM | POA: Insufficient documentation

## 2023-02-23 LAB — COMPREHENSIVE METABOLIC PANEL
ALT: 22 U/L (ref 0–44)
AST: 34 U/L (ref 15–41)
Albumin: 4.6 g/dL (ref 3.5–5.0)
Alkaline Phosphatase: 61 U/L (ref 38–126)
Anion gap: 12 (ref 5–15)
BUN: 7 mg/dL (ref 6–20)
CO2: 23 mmol/L (ref 22–32)
Calcium: 9.7 mg/dL (ref 8.9–10.3)
Chloride: 108 mmol/L (ref 98–111)
Creatinine, Ser: 0.96 mg/dL (ref 0.61–1.24)
GFR, Estimated: >60 mL/min (ref >60)
Glucose, Bld: 90 mg/dL (ref 70–99)
Potassium: 3.5 mmol/L (ref 3.5–5.1)
Sodium: 143 mmol/L (ref 135–145)
Total Bilirubin: 1.2 mg/dL — ABNORMAL HIGH (ref <1.2)
Total Protein: 8.5 g/dL — ABNORMAL HIGH (ref 6.5–8.1)

## 2023-02-23 LAB — CBC
HCT: 47.9 % (ref 39.0–52.0)
Hemoglobin: 15.3 g/dL (ref 13.0–17.0)
MCH: 30.7 pg (ref 26.0–34.0)
MCHC: 31.9 g/dL (ref 30.0–36.0)
MCV: 96.2 fL (ref 80.0–100.0)
Platelets: 234 10*3/uL (ref 150–400)
RBC: 4.98 MIL/uL (ref 4.22–5.81)
RDW: 12.6 % (ref 11.5–15.5)
WBC: 5.2 10*3/uL (ref 4.0–10.5)
nRBC: 0 % (ref 0.0–0.2)

## 2023-02-23 LAB — SALICYLATE LEVEL: Salicylate Lvl: <7 mg/dL — ABNORMAL LOW (ref 7.0–30.0)

## 2023-02-23 LAB — ACETAMINOPHEN LEVEL: Acetaminophen (Tylenol), Serum: <10 ug/mL — ABNORMAL LOW (ref 10–30)

## 2023-02-23 LAB — ETHANOL: Alcohol, Ethyl (B): <10 mg/dL (ref <10)

## 2023-02-23 MED ORDER — ACETAMINOPHEN 325 MG PO TABS
650.0000 mg | ORAL_TABLET | Freq: Once | ORAL | Status: AC
  Administered 2023-02-23: 650 mg via ORAL
  Filled 2023-02-23: qty 2

## 2023-02-23 MED ORDER — LORAZEPAM 1 MG PO TABS
2.0000 mg | ORAL_TABLET | Freq: Once | ORAL | Status: AC
  Administered 2023-02-23: 2 mg via ORAL
  Filled 2023-02-23: qty 2
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
## 2023-02-23 NOTE — BH Assessment (Signed)
This TTS consult will be completed by IRIS. IRIS Coordinator will communicate in this secure chat provider name and time of assessment. Thanks

## 2023-02-23 NOTE — ED Notes (Signed)
Pt belongings placed in locker number 4

## 2023-02-23 NOTE — ED Notes (Signed)
Sitter at bedside.

## 2023-02-23 NOTE — ED Notes (Signed)
Pt changed into purple scrubs 
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
## 2023-02-23 NOTE — ED Notes (Addendum)
Per staffing office, no sitter available at this time; ER charge RN, Asher Muir, made aware.

## 2023-02-23 NOTE — ED Notes (Signed)
IVC patient The patient was IVC'd on February 23, 2023.  IVC documents will expire on March 02, 2023.   3 copies have been placed in the orange zone on the clipboard. 1 copy has been placed in the red folder.

## 2023-02-23 NOTE — ED Triage Notes (Signed)
PT c/o lower back pain started yesterday. Pt states he fell on his back last week. Pt states he has int "cold chill go down his spine." Pt denies numbness and tingling. Pt walked from waiting area to triage.

## 2023-02-24 MED ORDER — DULOXETINE HCL 30 MG PO CPEP: ORAL_CAPSULE | ORAL | 0 refills | Status: DC

## 2023-02-24 NOTE — ED Notes (Signed)
Pt belongings and backpack returned to pt
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
## 2023-02-24 NOTE — Discharge Instructions (Addendum)
You are being started on duloxetine, this is to help with your depression and hopefully help with your chronic back pain as well.  Follow-up with a primary care physician, as well as a psychiatrist.  If you develop any other new or worsening or recurring symptoms then return to the ER for evaluation.

## 2023-02-24 NOTE — ED Notes (Signed)
Left a voicemail with pts legal gaurdian about pt being up for discharge. Pt informs me his mother doesn't drive and he takes the bus.

## 2023-02-24 NOTE — BH Assessment (Signed)
Dian Situ, MD  recommends discharge and follow up with outpatient resources.

## 2023-02-24 NOTE — ED Notes (Signed)
Patient has a Research scientist (physical sciences) at bedside.

## 2023-02-24 NOTE — ED Notes (Signed)
Pillow and ginger ale given per request

## 2023-02-24 NOTE — ED Provider Notes (Signed)
Emergency Medicine Observation Re-evaluation Note  Joe [name] is a 32 y.o. male, seen on rounds today.  Pt initially presented to the ED for complaints of Back Pain Currently, the patient is eating breakfast.  Physical Exam  BP 113/80 (BP Location: Right Arm)   Pulse 62   Temp 98.1 F (36.7 C) (Oral)   Resp 18   Ht 6' (1.829 m)   Wt 88 kg   SpO2 100%   BMI 26.31 kg/m  Physical Exam General: No acute distress Lungs: Normal effort Psych: No active SI/plan.  No psychosis  ED Course / MDM  EKG:   I have reviewed the labs performed to date as well as medications administered while in observation.  Recent changes in the last 24 hours include recommendations to start duloxetine from psychiatry.  Was cleared from psychiatry at 4 AM.  Plan  Current plan is for discharge.  Has chronic back pain.  I have rescinded IVC based on psychiatry recommendations.  Patient will be started on duloxetine per psychiatric recommendations.  He endorses chronic suicidal thoughts since he was in middle school but no active intent today.  He is not psychotic.  Clear for discharge.    Pricilla Loveless, MD 02/24/23 [phone]

## 2023-02-24 NOTE — ED Notes (Signed)
Assumed care of patient, NAD noted, sitter at bedside, pt denies any needs at this time. Pt and sitter is aware of the need for urine specimen.

## 2023-03-30 ENCOUNTER — Encounter: Payer: Self-pay | Admitting: Nurse Practitioner

## 2023-03-30 ENCOUNTER — Ambulatory Visit (INDEPENDENT_AMBULATORY_CARE_PROVIDER_SITE_OTHER): Payer: No Typology Code available for payment source | Admitting: Nurse Practitioner

## 2023-03-30 VITALS — BP 132/82 | HR 69 | Ht 68.0 in | Wt 238.0 lb

## 2023-03-30 DIAGNOSIS — F419 Anxiety disorder, unspecified: Secondary | ICD-10-CM | POA: Diagnosis not present

## 2023-03-30 DIAGNOSIS — M25511 Pain in right shoulder: Secondary | ICD-10-CM

## 2023-03-30 DIAGNOSIS — F17200 Nicotine dependence, unspecified, uncomplicated: Secondary | ICD-10-CM

## 2023-03-30 DIAGNOSIS — Z23 Encounter for immunization: Secondary | ICD-10-CM | POA: Insufficient documentation

## 2023-03-30 DIAGNOSIS — F1210 Cannabis abuse, uncomplicated: Secondary | ICD-10-CM

## 2023-03-30 DIAGNOSIS — G8929 Other chronic pain: Secondary | ICD-10-CM

## 2023-03-30 DIAGNOSIS — F32A Depression, unspecified: Secondary | ICD-10-CM

## 2023-03-30 HISTORY — DX: Depression, unspecified: F32.A

## 2023-03-30 MED ORDER — METHYLPREDNISOLONE NA SUC (PF) 40 MG IJ SOLR
40.0000 mg | Freq: Once | INTRAMUSCULAR | Status: AC
  Administered 2023-03-30: 40 mg via INTRAMUSCULAR

## 2023-03-30 MED ORDER — KETOROLAC TROMETHAMINE 30 MG/ML IJ SOLN
30.0000 mg | Freq: Once | INTRAMUSCULAR | Status: AC
  Administered 2023-03-30: 30 mg via INTRAMUSCULAR

## 2023-03-30 MED ORDER — KETOROLAC TROMETHAMINE 30 MG/ML IJ SOLN: 30.0000 mg | Freq: Once | INTRAMUSCULAR | Status: DC

## 2023-03-30 MED ORDER — METHYLPREDNISOLONE SODIUM SUCC 40 MG IJ SOLR: 40.0000 mg | Freq: Once | INTRAMUSCULAR | Status: DC

## 2023-03-30 MED ORDER — DULOXETINE HCL 60 MG PO CPEP: 60.0000 mg | ORAL_CAPSULE | Freq: Every day | ORAL | 0 refills | Status: DC

## 2023-03-30 MED ORDER — IBUPROFEN 600 MG PO TABS: 600.0000 mg | ORAL_TABLET | Freq: Three times a day (TID) | ORAL | 0 refills | Status: DC | PRN

## 2023-03-30 NOTE — Assessment & Plan Note (Signed)
Smokes about less than 1 pack/day  Asked about quitting: confirms that he currently smokes cigarettes Advise to quit smoking: Educated about QUITTING to reduce the risk of cancer, cardio and cerebrovascular disease. Assess willingness: Unwilling to quit at this time, not working on cutting back. Assist with counseling and pharmacotherapy: Counseled for 5 minutes and literature provided. Arrange for follow up: follow up in 2 months and continue to offer help.

## 2023-03-30 NOTE — Patient Instructions (Addendum)
You given Toradol 30 mg injection and Solu-Medrol 40 mg injection in the office today From tomorrow please start taking ibuprofen 600 mg every 8 hours as needed, alternate with Tylenol 650 mg every 6 hours as needed.  Please take ibuprofen with food to help prevent stomach upset   For anxiety and depression please continue duloxetine 60 mg daily and follow-up with a psychiatrist as discussed.    1. Anxiety and depression  - DULoxetine (CYMBALTA) 60 MG capsule; Take 1 capsule (60 mg total) by mouth daily.  Dispense: 90 capsule; Refill: 0 - Ambulatory referral to Psychiatry  2. Need for influenza vaccination   3. Chronic right shoulder pain  - ibuprofen (ADVIL) 600 MG tablet; Take 1 tablet (600 mg total) by mouth every 8 (eight) hours as needed.  Dispense: 30 tablet; Refill: 0     It is important that you exercise regularly at least 30 minutes 5 times a week as tolerated  Think about what you will eat, plan ahead. Choose " clean, green, fresh or frozen" over canned, processed or packaged foods which are more sugary, salty and fatty. 70 to 75% of food eaten should be vegetables and fruit. Three meals at set times with snacks allowed between meals, but they must be fruit or vegetables. Aim to eat over a 12 hour period , example 7 am to 7 pm, and STOP after  your last meal of the day. Drink water,generally about 64 ounces per day, no other drink is as healthy. Fruit juice is best enjoyed in a healthy way, by EATING the fruit.  Thanks for choosing [facility] we consider it a privelige to serve you.

## 2023-03-30 NOTE — Assessment & Plan Note (Addendum)
Flowsheet Row Office Visit from 03/30/2023 in [facility] Dept Of [facility]  PHQ-9 Total Score 15          03/30/2023    4:17 PM  GAD 7 : Generalized Anxiety Score  Nervous, Anxious, on Edge 3  Control/stop worrying 2  Worry too much - different things 2  Trouble relaxing 2  Restless 2  Easily annoyed or irritable 3  Afraid - awful might happen 1  Total GAD 7 Score 15  Anxiety Difficulty Somewhat difficult      Continue Cymbalta 60 mg daily Medication refilled Patient encouraged to seek urgent care at the [facility] behavioral [facility] clinic as needed for crisis Patient referred to psychiatrist Follow-up in the office in 6 weeks Checking TSH
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
## 2023-03-30 NOTE — Assessment & Plan Note (Signed)
 Patient educated on CDC recommendation for the vaccine. Verbal consent was obtained from the patient, vaccine administered by nurse, no sign of adverse reactions noted at this time. Patient education on arm soreness and use of tylenol or this patient  was discussed. Patient educated on the signs and symptoms of adverse effect and advise to contact the office if they occur. Vaccine information sheet given to patient.

## 2023-03-30 NOTE — Assessment & Plan Note (Signed)
-   ibuprofen (ADVIL) 600 MG tablet; Take 1 tablet (600 mg total) by mouth every 8 (eight) hours as needed.  Dispense: 30 tablet; Refill: 0 - methylPREDNISolone sodium succinate (SOLU-MEDROL) 40 MG injection 40 mg - ketorolac (TORADOL) 30 MG/ML injection 30 mg  Start taking ibuprofen tomorrow .encouraged to alternate ibuprofen with food to help prevent stomach upset Alternate ibuprofen with Tylenol 650 mg every 6 hours as needed Application of heat also encouraged

## 2023-04-02 ENCOUNTER — Other Ambulatory Visit: Payer: No Typology Code available for payment source

## 2023-04-02 DIAGNOSIS — F419 Anxiety disorder, unspecified: Secondary | ICD-10-CM

## 2023-04-03 LAB — TSH: TSH: 2.66 u[IU]/mL (ref 0.450–4.500)

## 2023-04-05 NOTE — Progress Notes (Unsigned)
Patient was sleep and unable to come to the phone per person who answered the call, advised to return my call- CB

## 2023-04-08 NOTE — Progress Notes (Signed)
LVM advising of labs per DPR. Normal. KH

## 2023-05-31 ENCOUNTER — Ambulatory Visit (INDEPENDENT_AMBULATORY_CARE_PROVIDER_SITE_OTHER): Payer: No Typology Code available for payment source | Admitting: Nurse Practitioner

## 2023-05-31 ENCOUNTER — Encounter: Payer: Self-pay | Admitting: Nurse Practitioner

## 2023-05-31 VITALS — BP 135/79 | HR 72 | Temp 97.7°F | Wt 252.0 lb

## 2023-05-31 DIAGNOSIS — F419 Anxiety disorder, unspecified: Secondary | ICD-10-CM

## 2023-05-31 DIAGNOSIS — G8929 Other chronic pain: Secondary | ICD-10-CM | POA: Diagnosis not present

## 2023-05-31 DIAGNOSIS — F322 Major depressive disorder, single episode, severe without psychotic features: Secondary | ICD-10-CM | POA: Diagnosis not present

## 2023-05-31 DIAGNOSIS — F17200 Nicotine dependence, unspecified, uncomplicated: Secondary | ICD-10-CM

## 2023-05-31 DIAGNOSIS — F1210 Cannabis abuse, uncomplicated: Secondary | ICD-10-CM

## 2023-05-31 DIAGNOSIS — M5441 Lumbago with sciatica, right side: Secondary | ICD-10-CM

## 2023-05-31 DIAGNOSIS — F32A Depression, unspecified: Secondary | ICD-10-CM

## 2023-05-31 MED ORDER — IBUPROFEN 600 MG PO TABS: 600.0000 mg | ORAL_TABLET | Freq: Three times a day (TID) | ORAL | 0 refills | Status: AC | PRN

## 2023-05-31 MED ORDER — KETOROLAC TROMETHAMINE 60 MG/2ML IM SOLN
60.0000 mg | Freq: Once | INTRAMUSCULAR | Status: AC
  Administered 2023-05-31: 60 mg via INTRAMUSCULAR

## 2023-05-31 MED ORDER — DULOXETINE HCL 60 MG PO CPEP: 60.0000 mg | ORAL_CAPSULE | Freq: Every day | ORAL | 1 refills | Status: DC

## 2023-05-31 NOTE — Assessment & Plan Note (Signed)
    05/31/2023   10:25 AM 03/30/2023    4:15 PM  Depression screen PHQ 2/9  Decreased Interest 3 1  Down, Depressed, Hopeless 3 1  PHQ - 2 Score 6 2  Altered sleeping 2 2  Tired, decreased energy 2 3  Change in appetite 1 1  Feeling bad or failure about yourself  0 3  Trouble concentrating 3 2  Moving slowly or fidgety/restless 0 1  Suicidal thoughts 1 1  PHQ-9 Score 15 15  Difficult doing work/chores Not difficult at all Extremely dIfficult

## 2023-05-31 NOTE — Assessment & Plan Note (Signed)
 Need to avoid use of illicit drugs discussed

## 2023-05-31 NOTE — Assessment & Plan Note (Signed)
 Ibuprofen  600 mg every 8 hours resent to the pharmacy Continue Cymbalta  60 mg daily Toradol  60 mg injection given in the office today Application of heating pad , stretching exercises encouraged

## 2023-05-31 NOTE — Addendum Note (Signed)
 Addended by: Julian Obey on: 05/31/2023 01:30 PM   Modules accepted: Orders

## 2023-05-31 NOTE — Patient Instructions (Signed)
 You were given Toradol  60mg  injection today, please start taking ibuprofen  600mg  every 8 hors as needed from tomorrow..  Please alternate ibuprofen  with Tylenol  650 mg every 6 hours as needed.  Continue application of heating pad as needed.  I have referred you to orthopedics  . Chronic midline low back pain with right-sided sciatica (Primary)  - ibuprofen  (ADVIL ) 600 MG tablet; Take 1 tablet (600 mg total) by mouth every 8 (eight) hours as needed.  Dispense: 30 tablet; Refill: 0 - Ambulatory referral to Sports Medicine . MDD (major depressive disorder), severe (HCC)    Tobacco use disorder    Marijuana abuse   . Anxiety and depression  - DULoxetine  (CYMBALTA ) 60 MG capsule; Take 1 capsule (60 mg total) by mouth daily.  Dispense: 90 capsule; Refill: 1    It is important that you exercise regularly at least 30 minutes 5 times a week as tolerated  Think about what you will eat, plan ahead. Choose " clean, green, fresh or frozen" over canned, processed or packaged foods which are more sugary, salty and fatty. 70 to 75% of food eaten should be vegetables and fruit. Three meals at set times with snacks allowed between meals, but they must be fruit or vegetables. Aim to eat over a 12 hour period , example 7 am to 7 pm, and STOP after  your last meal of the day. Drink water,generally about 64 ounces per day, no other drink is as healthy. Fruit juice is best enjoyed in a healthy way, by EATING the fruit.  [facility] for choosing [facility] we consider it a privelige to serve you.

## 2023-05-31 NOTE — Assessment & Plan Note (Addendum)
 Has quit smoking since December 2024.  Patient congratulated on his efforts at smoking cessation.  Encouraged to continue to abstain from smoking tobacco

## 2023-05-31 NOTE — Progress Notes (Signed)
 Established Patient Office Visit  Subjective:  Patient ID: Joe [name], male    DOB: 12/19/1990  Age: 33 y.o. MRN: [id number]  CC:  Chief Complaint  Patient presents with   Depression    HPI Joe [name] is a 33 y.o. male  has a past medical history of Anxiety and depression (03/30/2023), Asthma, MDD (major depressive disorder), severe (HCC) (10/16/2017), and Methamphetamine abuse (HCC) (10/16/2017).  Patient presents for follow-up for his chronic medical conditions  Anxiety and depression.  Currently on Cymbalta  60 mg daily, states that he has upcoming appointment to establish care with a psychiatrist.  He denies SI, HI  Chronic low back pain patient complains of ongoing chronic low back pain radiating down his right knee and right foot.  Describes the pain as a tingling sharp pain currently rated 4/10. he was prescribed ibuprofen  600 mg every 8 hours as needed for right shoulder pain at his last visit but stated that he did not pick up the medication from the pharmacy.  He takes OTC ibuprofen  and uses heating pad at home as needed.        Past Medical History:  Diagnosis Date   Anxiety and depression 03/30/2023   Asthma    MDD (major depressive disorder), severe (HCC) 10/16/2017   Methamphetamine abuse (HCC) 10/16/2017    Past Surgical History:  Procedure Laterality Date   KNEE SURGERY      Family History  Problem Relation Age of Onset   Diabetes Father     Social History   Socioeconomic History   Marital status: Single    Spouse name: Not on file   Number of children: Not on file   Years of education: Not on file   Highest education level: Not on file  Occupational History   Not on file  Tobacco Use   Smoking status: Former    Current packs/day: 0.50    Types: Cigarettes   Smokeless tobacco: Never  Vaping Use   Vaping status: Former  Substance and Sexual Activity   Alcohol use: Yes    Comment: occ   Drug use: Yes    Types: Marijuana    Comment: occ    Sexual activity: Yes  Other Topics Concern   Not on file  Social History Narrative   Lives with his mother    Social Drivers of Corporate investment banker Strain: Not on file  Food Insecurity: Not on file  Transportation Needs: Not on file  Physical Activity: Not on file  Stress: Not on file  Social Connections: Not on file  Intimate Partner Violence: Not on file    Outpatient Medications Prior to Visit  Medication Sig Dispense Refill   DULoxetine  (CYMBALTA ) 60 MG capsule Take 1 capsule (60 mg total) by mouth daily. 90 capsule 0   ibuprofen  (ADVIL ) 200 MG tablet Take 200 mg by mouth every 6 (six) hours as needed.     ibuprofen  (ADVIL ) 600 MG tablet Take 1 tablet (600 mg total) by mouth every 8 (eight) hours as needed. (Patient not taking: Reported on 05/31/2023) 30 tablet 0   No facility-administered medications prior to visit.    No Known Allergies  ROS Review of Systems  Constitutional:  Negative for appetite change, chills, fatigue and fever.  HENT:  Negative for congestion, postnasal drip, rhinorrhea and sneezing.   Respiratory:  Negative for cough, shortness of breath and wheezing.   Cardiovascular:  Negative for chest pain, palpitations and leg swelling.  Gastrointestinal:  Negative  for abdominal pain, constipation, nausea and vomiting.  Genitourinary:  Negative for difficulty urinating, dysuria, flank pain and frequency.  Musculoskeletal:  Positive for arthralgias and back pain. Negative for joint swelling and myalgias.  Skin:  Negative for color change, pallor, rash and wound.  Neurological:  Negative for dizziness, facial asymmetry, weakness, numbness and headaches.  Psychiatric/Behavioral:  Negative for behavioral problems, confusion, self-injury and suicidal ideas.       Objective:    Physical Exam Vitals and nursing note reviewed.  Constitutional:      General: He is not in acute distress.    Appearance: Normal appearance. He is obese. He is not  ill-appearing, toxic-appearing or diaphoretic.  HENT:     Mouth/Throat:     Mouth: Mucous membranes are moist.     Pharynx: Oropharynx is clear. No oropharyngeal exudate or posterior oropharyngeal erythema.  Eyes:     General: No scleral icterus.       Right eye: No discharge.        Left eye: No discharge.     Extraocular Movements: Extraocular movements intact.     Conjunctiva/sclera: Conjunctivae normal.  Cardiovascular:     Rate and Rhythm: Normal rate and regular rhythm.     Pulses: Normal pulses.     Heart sounds: Normal heart sounds. No murmur heard.    No friction rub. No gallop.  Pulmonary:     Effort: Pulmonary effort is normal. No respiratory distress.     Breath sounds: Normal breath sounds. No stridor. No wheezing, rhonchi or rales.  Chest:     Chest wall: No tenderness.  Abdominal:     General: There is no distension.     Palpations: Abdomen is soft.     Tenderness: There is no abdominal tenderness. There is no right CVA tenderness, left CVA tenderness or guarding.  Musculoskeletal:        General: Tenderness present. No swelling, deformity or signs of injury.     Right lower leg: No edema.     Left lower leg: No edema.     Comments: Tenderness on palpation of mid low back area, right knee and right foot.  Skin warm and dry no redness or swelling noted.  Has palpable pedal pulses  Skin:    General: Skin is warm and dry.     Capillary Refill: Capillary refill takes less than 2 seconds.     Coloration: Skin is not jaundiced or pale.     Findings: No bruising, erythema or lesion.  Neurological:     Mental Status: He is alert and oriented to person, place, and time.     Motor: No weakness.     Coordination: Coordination normal.     Gait: Gait normal.  Psychiatric:        Mood and Affect: Mood normal.        Behavior: Behavior normal.        Thought Content: Thought content normal.        Judgment: Judgment normal.     BP 135/79   Pulse 72   Temp 97.7 F  (36.5 C)   Wt 252 lb (114.3 kg)   SpO2 100%   BMI 38.32 kg/m  Wt Readings from Last 3 Encounters:  05/31/23 252 lb (114.3 kg)  03/30/23 238 lb (108 kg)  02/23/23 194 lb 0.1 oz (88 kg)    Lab Results  Component Value Date   TSH 2.660 04/02/2023   Lab Results  Component Value Date  WBC 5.2 02/23/2023   HGB 15.3 02/23/2023   HCT 47.9 02/23/2023   MCV 96.2 02/23/2023   PLT 234 02/23/2023   Lab Results  Component Value Date   NA 143 02/23/2023   K 3.5 02/23/2023   CO2 23 02/23/2023   GLUCOSE 90 02/23/2023   BUN 7 02/23/2023   CREATININE 0.96 02/23/2023   BILITOT 1.2 (H) 02/23/2023   ALKPHOS 61 02/23/2023   AST 34 02/23/2023   ALT 22 02/23/2023   PROT 8.5 (H) 02/23/2023   ALBUMIN 4.6 02/23/2023   CALCIUM 9.7 02/23/2023   ANIONGAP 12 02/23/2023   No results found for: "CHOL" No results found for: "HDL" No results found for: "LDLCALC" No results found for: "TRIG" No results found for: "CHOLHDL" No results found for: "HGBA1C"    Assessment & Plan:   Problem List Items Addressed This Visit       Nervous and Auditory   Chronic midline low back pain with right-sided sciatica - Primary   Ibuprofen  600 mg every 8 hours resent to the pharmacy Continue Cymbalta  60 mg daily Toradol  60 mg injection given in the office today Application of heating pad , stretching exercises encouraged      Relevant Medications   ibuprofen  (ADVIL ) 600 MG tablet   DULoxetine  (CYMBALTA ) 60 MG capsule   Other Relevant Orders   Ambulatory referral to Sports Medicine     Other   MDD (major depressive disorder), severe (HCC)      05/31/2023   10:25 AM 03/30/2023    4:15 PM  Depression screen PHQ 2/9  Decreased Interest 3 1  Down, Depressed, Hopeless 3 1  PHQ - 2 Score 6 2  Altered sleeping 2 2  Tired, decreased energy 2 3  Change in appetite 1 1  Feeling bad or failure about yourself  0 3  Trouble concentrating 3 2  Moving slowly or fidgety/restless 0 1  Suicidal thoughts 1 1   PHQ-9 Score 15 15  Difficult doing work/chores Not difficult at all Extremely dIfficult         Relevant Medications   DULoxetine  (CYMBALTA ) 60 MG capsule   Anxiety and depression      05/31/2023   10:25 AM 03/30/2023    4:15 PM  Depression screen PHQ 2/9  Decreased Interest 3 1  Down, Depressed, Hopeless 3 1  PHQ - 2 Score 6 2  Altered sleeping 2 2  Tired, decreased energy 2 3  Change in appetite 1 1  Feeling bad or failure about yourself  0 3  Trouble concentrating 3 2  Moving slowly or fidgety/restless 0 1  Suicidal thoughts 1 1  PHQ-9 Score 15 15  Difficult doing work/chores Not difficult at all Extremely dIfficult      05/31/2023   10:29 AM 03/30/2023    4:17 PM  GAD 7 : Generalized Anxiety Score  Nervous, Anxious, on Edge 3 3  Control/stop worrying 3 2  Worry too much - different things 3 2  Trouble relaxing 3 2  Restless 1 2  Easily annoyed or irritable 3 3  Afraid - awful might happen 1 1  Total GAD 7 Score 17 15  Anxiety Difficulty Somewhat difficult Somewhat difficult  Patient currently denies SI, HI Continue Cymbalta  60 mg daily, medication refilled Encouraged to seek urgent care needed at [facility] behavioral health care clinic if needed. Encouraged to keep upcoming appointment with psychiatrist         Relevant Medications   DULoxetine  (CYMBALTA )  60 MG capsule   Marijuana abuse   Need to avoid use of illicit drugs discussed      Tobacco use disorder   Has quit smoking since December 2024.  Patient congratulated on his efforts at smoking cessation.  Encouraged to continue to abstain from smoking tobacco       Meds ordered this encounter  Medications   ibuprofen  (ADVIL ) 600 MG tablet    Sig: Take 1 tablet (600 mg total) by mouth every 8 (eight) hours as needed.    Dispense:  30 tablet    Refill:  0   DULoxetine  (CYMBALTA ) 60 MG capsule    Sig: Take 1 capsule (60 mg total) by mouth daily.    Dispense:  90 capsule    Refill:  1     Follow-up: Return in about 3 months (around 08/28/2023) for ANXIETY, DEPRESSION, CPE.    Cheridan Kibler R Brya Simerly, FNP

## 2023-05-31 NOTE — Assessment & Plan Note (Signed)
    05/31/2023   10:25 AM 03/30/2023    4:15 PM  Depression screen PHQ 2/9  Decreased Interest 3 1  Down, Depressed, Hopeless 3 1  PHQ - 2 Score 6 2  Altered sleeping 2 2  Tired, decreased energy 2 3  Change in appetite 1 1  Feeling bad or failure about yourself  0 3  Trouble concentrating 3 2  Moving slowly or fidgety/restless 0 1  Suicidal thoughts 1 1  PHQ-9 Score 15 15  Difficult doing work/chores Not difficult at all Extremely dIfficult      05/31/2023   10:29 AM 03/30/2023    4:17 PM  GAD 7 : Generalized Anxiety Score  Nervous, Anxious, on Edge 3 3  Control/stop worrying 3 2  Worry too much - different things 3 2  Trouble relaxing 3 2  Restless 1 2  Easily annoyed or irritable 3 3  Afraid - awful might happen 1 1  Total GAD 7 Score 17 15  Anxiety Difficulty Somewhat difficult Somewhat difficult  Patient currently denies SI, HI Continue Cymbalta  60 mg daily, medication refilled Encouraged to seek urgent care needed at [facility] behavioral health care clinic if needed. Encouraged to keep upcoming appointment with psychiatrist

## 2023-06-01 ENCOUNTER — Ambulatory Visit: Payer: Self-pay | Admitting: Nurse Practitioner

## 2023-06-02 ENCOUNTER — Ambulatory Visit: Payer: No Typology Code available for payment source | Admitting: Sports Medicine

## 2023-06-02 NOTE — Progress Notes (Deleted)
    Joe [name] [name] [address] [place] [address] Phone: [phone]   Assessment and Plan:     There are no diagnoses linked to this encounter.  ***   Pertinent previous records reviewed include ***    Follow Up: ***     Subjective:   I, Joe [name], am serving as a Neurosurgeon for Doctor Richardean Sale  Chief Complaint: low back pain   HPI:   06/02/23 Patient is a 33 year old male with low back pain. Patient states  Relevant Historical Information: ***  Additional pertinent review of systems negative.   Current Outpatient Medications:    DULoxetine (CYMBALTA) 60 MG capsule, Take 1 capsule (60 mg total) by mouth daily., Disp: 90 capsule, Rfl: 1   ibuprofen (ADVIL) 600 MG tablet, Take 1 tablet (600 mg total) by mouth every 8 (eight) hours as needed., Disp: 30 tablet, Rfl: 0   Objective:     There were no vitals filed for this visit.    There is no height or weight on file to calculate BMI.    Physical Exam:    ***   Electronically signed by:  Joe [name] 7:39 AM 06/02/23

## 2023-06-03 NOTE — Progress Notes (Deleted)
    Aleen Sells [name] [address] [place] [address] Phone: [phone]   Assessment and Plan:     There are no diagnoses linked to this encounter.  ***   Pertinent previous records reviewed include ***    Follow Up: ***     Subjective:   I, Clinten Howk, am serving as a Neurosurgeon for Doctor Richardean Sale  Chief Complaint: low back pain   HPI:   06/04/2023 Patient is a 33 year old male with low back pain. Patient states  Relevant Historical Information: ***  Additional pertinent review of systems negative.   Current Outpatient Medications:    DULoxetine (CYMBALTA) 60 MG capsule, Take 1 capsule (60 mg total) by mouth daily., Disp: 90 capsule, Rfl: 1   ibuprofen (ADVIL) 600 MG tablet, Take 1 tablet (600 mg total) by mouth every 8 (eight) hours as needed., Disp: 30 tablet, Rfl: 0   Objective:     There were no vitals filed for this visit.    There is no height or weight on file to calculate BMI.    Physical Exam:    ***   Electronically signed by:  Aleen Sells [name] 7:39 AM 06/03/23

## 2023-06-04 ENCOUNTER — Ambulatory Visit: Payer: No Typology Code available for payment source | Admitting: Sports Medicine

## 2023-06-07 ENCOUNTER — Ambulatory Visit (INDEPENDENT_AMBULATORY_CARE_PROVIDER_SITE_OTHER): Payer: No Typology Code available for payment source | Admitting: Sports Medicine

## 2023-06-07 VITALS — HR 74 | Ht 68.0 in | Wt 256.0 lb

## 2023-06-07 DIAGNOSIS — R202 Paresthesia of skin: Secondary | ICD-10-CM

## 2023-06-07 DIAGNOSIS — G8929 Other chronic pain: Secondary | ICD-10-CM

## 2023-06-07 DIAGNOSIS — M5441 Lumbago with sciatica, right side: Secondary | ICD-10-CM

## 2023-06-07 DIAGNOSIS — R29898 Other symptoms and signs involving the musculoskeletal system: Secondary | ICD-10-CM

## 2023-06-07 DIAGNOSIS — R200 Anesthesia of skin: Secondary | ICD-10-CM

## 2023-06-07 MED ORDER — MELOXICAM 15 MG PO TABS: 15.0000 mg | ORAL_TABLET | Freq: Every day | ORAL | 0 refills | Status: AC
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
## 2023-06-07 NOTE — Patient Instructions (Signed)
-   Start meloxicam 15 mg daily x2 weeks.  If still having pain after 2 weeks, complete 3rd-week of NSAID. May use remaining NSAID as needed once daily for pain control.  Do not to use additional over-the-counter NSAIDs (ibuprofen, naproxen, Advil, Aleve) while taking prescription NSAIDs.  May use Tylenol [phone] mg 2 to 3 times a day for breakthrough pain. Continue duloxetine medicine  Mri referral  4 week follow up

## 2023-06-14 NOTE — Addendum Note (Signed)
 Addended by: Evon Slack on: 06/14/2023 12:27 PM   Modules accepted: Orders

## 2023-06-24 ENCOUNTER — Other Ambulatory Visit: Payer: No Typology Code available for payment source

## 2023-06-25 ENCOUNTER — Encounter (HOSPITAL_COMMUNITY): Payer: Self-pay

## 2023-06-25 ENCOUNTER — Ambulatory Visit (HOSPITAL_BASED_OUTPATIENT_CLINIC_OR_DEPARTMENT_OTHER)
Admission: RE | Admit: 2023-06-25 | Discharge: 2023-06-25 | Disposition: A | Discharge status: Home / Self Care | Payer: No Typology Code available for payment source | Source: Ambulatory Visit | Attending: Sports Medicine | Admitting: Sports Medicine

## 2023-06-25 DIAGNOSIS — R29898 Other symptoms and signs involving the musculoskeletal system: Secondary | ICD-10-CM | POA: Diagnosis present

## 2023-06-25 DIAGNOSIS — G8929 Other chronic pain: Secondary | ICD-10-CM

## 2023-06-25 DIAGNOSIS — R202 Paresthesia of skin: Secondary | ICD-10-CM | POA: Insufficient documentation

## 2023-06-25 DIAGNOSIS — R200 Anesthesia of skin: Secondary | ICD-10-CM

## 2023-06-25 DIAGNOSIS — M5441 Lumbago with sciatica, right side: Secondary | ICD-10-CM | POA: Diagnosis present

## 2023-06-29 ENCOUNTER — Ambulatory Visit (INDEPENDENT_AMBULATORY_CARE_PROVIDER_SITE_OTHER): Payer: No Typology Code available for payment source | Admitting: Clinical

## 2023-06-29 DIAGNOSIS — F259 Schizoaffective disorder, unspecified: Secondary | ICD-10-CM

## 2023-06-29 DIAGNOSIS — F4310 Post-traumatic stress disorder, unspecified: Secondary | ICD-10-CM

## 2023-06-29 DIAGNOSIS — F1220 Cannabis dependence, uncomplicated: Secondary | ICD-10-CM
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
## 2023-07-05 ENCOUNTER — Ambulatory Visit: Payer: No Typology Code available for payment source | Admitting: Sports Medicine

## 2023-07-06 ENCOUNTER — Ambulatory Visit (HOSPITAL_BASED_OUTPATIENT_CLINIC_OR_DEPARTMENT_OTHER): Payer: No Typology Code available for payment source | Admitting: Family

## 2023-07-06 DIAGNOSIS — F419 Anxiety disorder, unspecified: Secondary | ICD-10-CM

## 2023-07-06 DIAGNOSIS — F32A Depression, unspecified: Secondary | ICD-10-CM

## 2023-07-06 MED ORDER — DULOXETINE HCL 60 MG PO CPEP: 60.0000 mg | ORAL_CAPSULE | Freq: Every day | ORAL | 1 refills | Status: DC
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
## 2023-07-07 ENCOUNTER — Encounter: Payer: Self-pay | Admitting: Sports Medicine

## 2023-08-11 ENCOUNTER — Ambulatory Visit (HOSPITAL_COMMUNITY): Admitting: Student

## 2023-08-30 ENCOUNTER — Ambulatory Visit: Payer: Self-pay | Admitting: Nurse Practitioner

## 2023-09-06 ENCOUNTER — Ambulatory Visit (HOSPITAL_COMMUNITY): Admitting: Family

## 2023-09-06 ENCOUNTER — Encounter (HOSPITAL_COMMUNITY): Payer: Self-pay | Admitting: Family

## 2023-09-06 ENCOUNTER — Other Ambulatory Visit: Payer: Self-pay

## 2023-09-06 VITALS — BP 157/97 | HR 96 | Ht 71.5 in | Wt 248.0 lb

## 2023-09-06 DIAGNOSIS — F419 Anxiety disorder, unspecified: Secondary | ICD-10-CM

## 2023-09-06 DIAGNOSIS — F32A Depression, unspecified: Secondary | ICD-10-CM | POA: Diagnosis not present

## 2023-09-06 DIAGNOSIS — F259 Schizoaffective disorder, unspecified: Secondary | ICD-10-CM

## 2023-09-06 MED ORDER — ARIPIPRAZOLE 2 MG PO TABS: 2.0000 mg | ORAL_TABLET | Freq: Every day | ORAL | 0 refills | Status: DC
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
## 2023-09-21 ENCOUNTER — Ambulatory Visit (INDEPENDENT_AMBULATORY_CARE_PROVIDER_SITE_OTHER): Admitting: Sports Medicine

## 2023-09-21 VITALS — BP 104/60 | HR 96 | Ht 71.5 in | Wt 249.0 lb

## 2023-09-21 DIAGNOSIS — G8929 Other chronic pain: Secondary | ICD-10-CM | POA: Diagnosis not present

## 2023-09-21 DIAGNOSIS — M545 Low back pain, unspecified: Secondary | ICD-10-CM | POA: Diagnosis not present

## 2023-09-21 MED ORDER — METHYLPREDNISOLONE 4 MG PO TBPK: ORAL_TABLET | ORAL | 0 refills | Status: DC

## 2023-09-21 NOTE — Patient Instructions (Addendum)
 Low back HEP reprint. Prednisone dose pack Referral to Physical therapy. Recommend calling your therapist and let him/her know that medication is too expensive and ask for alternatives. Follow up in 8 weeks.

## 2023-10-04 ENCOUNTER — Ambulatory Visit: Admitting: Nurse Practitioner

## 2023-10-12 ENCOUNTER — Ambulatory Visit: Admitting: Physical Therapy

## 2023-10-19 ENCOUNTER — Ambulatory Visit: Payer: Self-pay | Admitting: Physical Therapy

## 2023-10-20 ENCOUNTER — Encounter: Payer: Self-pay | Admitting: Physical Therapy

## 2023-10-20 ENCOUNTER — Ambulatory Visit: Payer: Self-pay | Attending: Sports Medicine | Admitting: Physical Therapy

## 2023-10-20 VITALS — BP 117/82 | HR 84

## 2023-10-20 DIAGNOSIS — R293 Abnormal posture: Secondary | ICD-10-CM | POA: Insufficient documentation

## 2023-10-20 DIAGNOSIS — M545 Low back pain, unspecified: Secondary | ICD-10-CM | POA: Insufficient documentation

## 2023-10-20 DIAGNOSIS — M5459 Other low back pain: Secondary | ICD-10-CM | POA: Insufficient documentation

## 2023-10-20 DIAGNOSIS — G8929 Other chronic pain: Secondary | ICD-10-CM | POA: Insufficient documentation

## 2023-10-20 DIAGNOSIS — M6281 Muscle weakness (generalized): Secondary | ICD-10-CM | POA: Insufficient documentation

## 2023-10-20 DIAGNOSIS — R2689 Other abnormalities of gait and mobility: Secondary | ICD-10-CM | POA: Insufficient documentation

## 2023-10-20 DIAGNOSIS — R2681 Unsteadiness on feet: Secondary | ICD-10-CM | POA: Insufficient documentation

## 2023-10-20 DIAGNOSIS — M62830 Muscle spasm of back: Secondary | ICD-10-CM | POA: Insufficient documentation
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
## 2023-10-27 ENCOUNTER — Ambulatory Visit: Payer: Self-pay | Admitting: Physical Therapy

## 2023-10-29 ENCOUNTER — Ambulatory Visit: Payer: Self-pay | Admitting: Physical Therapy

## 2023-11-02 ENCOUNTER — Ambulatory Visit: Payer: Self-pay | Admitting: Physical Therapy

## 2023-11-02 VITALS — BP 132/86 | HR 90

## 2023-11-02 DIAGNOSIS — R2681 Unsteadiness on feet: Secondary | ICD-10-CM

## 2023-11-02 DIAGNOSIS — M6281 Muscle weakness (generalized): Secondary | ICD-10-CM

## 2023-11-02 DIAGNOSIS — M5459 Other low back pain: Secondary | ICD-10-CM

## 2023-11-02 DIAGNOSIS — R2689 Other abnormalities of gait and mobility: Secondary | ICD-10-CM

## 2023-11-02 DIAGNOSIS — R293 Abnormal posture: Secondary | ICD-10-CM
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
## 2023-11-04 ENCOUNTER — Ambulatory Visit: Payer: Self-pay | Admitting: Physical Therapy

## 2023-11-10 ENCOUNTER — Ambulatory Visit: Payer: Self-pay | Admitting: Physical Therapy

## 2023-11-12 ENCOUNTER — Ambulatory Visit: Payer: Self-pay | Admitting: Physical Therapy

## 2023-11-12 DIAGNOSIS — R293 Abnormal posture: Secondary | ICD-10-CM

## 2023-11-12 DIAGNOSIS — R2681 Unsteadiness on feet: Secondary | ICD-10-CM

## 2023-11-12 DIAGNOSIS — M6281 Muscle weakness (generalized): Secondary | ICD-10-CM

## 2023-11-12 DIAGNOSIS — R2689 Other abnormalities of gait and mobility: Secondary | ICD-10-CM

## 2023-11-12 DIAGNOSIS — M5459 Other low back pain: Secondary | ICD-10-CM
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
## 2023-11-16 ENCOUNTER — Other Ambulatory Visit (HOSPITAL_COMMUNITY): Payer: Self-pay

## 2023-11-16 ENCOUNTER — Ambulatory Visit (INDEPENDENT_AMBULATORY_CARE_PROVIDER_SITE_OTHER): Payer: Self-pay | Admitting: Sports Medicine

## 2023-11-16 ENCOUNTER — Ambulatory Visit (INDEPENDENT_AMBULATORY_CARE_PROVIDER_SITE_OTHER): Admitting: Nurse Practitioner

## 2023-11-16 ENCOUNTER — Encounter: Payer: Self-pay | Admitting: Nurse Practitioner

## 2023-11-16 VITALS — BP 110/70 | HR 88 | Ht 71.5 in | Wt 262.2 lb

## 2023-11-16 VITALS — BP 118/69 | HR 70 | Temp 97.2°F | Wt 263.0 lb

## 2023-11-16 DIAGNOSIS — M545 Low back pain, unspecified: Secondary | ICD-10-CM

## 2023-11-16 DIAGNOSIS — F32A Depression, unspecified: Secondary | ICD-10-CM

## 2023-11-16 DIAGNOSIS — R21 Rash and other nonspecific skin eruption: Secondary | ICD-10-CM | POA: Insufficient documentation

## 2023-11-16 DIAGNOSIS — F17200 Nicotine dependence, unspecified, uncomplicated: Secondary | ICD-10-CM

## 2023-11-16 DIAGNOSIS — E669 Obesity, unspecified: Secondary | ICD-10-CM | POA: Insufficient documentation

## 2023-11-16 DIAGNOSIS — G8929 Other chronic pain: Secondary | ICD-10-CM

## 2023-11-16 DIAGNOSIS — M5441 Lumbago with sciatica, right side: Secondary | ICD-10-CM

## 2023-11-16 DIAGNOSIS — F259 Schizoaffective disorder, unspecified: Secondary | ICD-10-CM | POA: Insufficient documentation

## 2023-11-16 DIAGNOSIS — F419 Anxiety disorder, unspecified: Secondary | ICD-10-CM

## 2023-11-16 MED ORDER — METHYLPREDNISOLONE 4 MG PO TBPK: ORAL_TABLET | ORAL | 0 refills | Status: AC

## 2023-11-16 MED ORDER — ARIPIPRAZOLE 2 MG PO TABS: 2.0000 mg | ORAL_TABLET | Freq: Every day | ORAL | 0 refills | Status: DC

## 2023-11-16 MED ORDER — DULOXETINE HCL 60 MG PO CPEP: 60.0000 mg | ORAL_CAPSULE | Freq: Every day | ORAL | 1 refills | Status: DC

## 2023-11-16 MED ORDER — ARIPIPRAZOLE 2 MG PO TABS
2.0000 mg | ORAL_TABLET | Freq: Every day | ORAL | 1 refills | Status: DC
  Filled 2023-11-16: qty 30, 30d supply, fill #0

## 2023-11-16 MED ORDER — DULOXETINE HCL 60 MG PO CPEP
60.0000 mg | ORAL_CAPSULE | Freq: Every day | ORAL | 1 refills | Status: AC
  Filled 2023-11-16 – 2023-12-07 (×2): qty 90, 90d supply, fill #0

## 2023-11-16 NOTE — Progress Notes (Signed)
 Established Patient Office Visit  Subjective:  Patient ID: Joe [name], male    DOB: [birth year]  Age: 33 y.o. MRN: [id number]  CC:  Chief Complaint  Patient presents with   Back Pain   Anxiety    HPI Joe [name] is a 33 y.o. male  has a past medical history of Anxiety and depression (03/30/2023), Asthma, MDD (major depressive disorder), severe (HCC) (10/16/2017), and Methamphetamine abuse (HCC) (10/16/2017).  Patient presents for follow-up for his chronic medical conditions He is accompanied by his baby mama  Anxiety and depression.  Was on Cymbalta  60 mg daily medication was last refilled by psychiatrist but the patient stated that he could not afford the medication and so did not pick it up from the pharmacy  Schizophrenia.  Was prescribed Abilify  5 mg daily but did not pick up medication due to cost  Chronic low back pain he continues to have mid low back pain, has started physical therapy stated that physical therapy is helping a little bit, sees orthopedics today at 2 PM.  His back pain is currently rated 8/10   Rashes.  Patient complains of itchy rashes on his left that started 2 weeks ago, he denies any known contact with irritant has tried A&D ointment that helped a little  Prescription for Cymbalta  and Abilify  sent to [facility], he was encouraged to let the pharmacist know if he has any trouble paying for his medication.  Also advised to call this office if he is not able to get his medications filled  Stated that he was in jail briefly and was recently released.        Past Medical History:  Diagnosis Date   Anxiety and depression 03/30/2023   Asthma    MDD (major depressive disorder), severe (HCC) 10/16/2017   Methamphetamine abuse (HCC) 10/16/2017    Past Surgical History:  Procedure Laterality Date   KNEE SURGERY      Family History  Problem Relation Age of Onset   Diabetes Father     Social History   Socioeconomic History   Marital  status: Single    Spouse name: Not on file   Number of children: Not on file   Years of education: Not on file   Highest education level: 12th grade  Occupational History   Not on file  Tobacco Use   Smoking status: Every Day    Current packs/day: 0.50    Types: Cigarettes   Smokeless tobacco: Never  Vaping Use   Vaping status: Former  Substance and Sexual Activity   Alcohol use: Yes    Comment: recent increase   Drug use: Yes    Types: Marijuana    Comment: occ   Sexual activity: Yes  Other Topics Concern   Not on file  Social History Narrative   Lives with his mother    Social Drivers of Health   Financial Resource Strain: Medium Risk (08/27/2023)   Overall Financial Resource Strain (CARDIA)    Difficulty of Paying Living Expenses: Somewhat hard  Food Insecurity: Food Insecurity Present (08/27/2023)   Hunger Vital Sign    Worried About Running Out of Food in the Last Year: Often true    Ran Out of Food in the Last Year: Often true  Transportation Needs: Unmet Transportation Needs (08/27/2023)   PRAPARE - Transportation    Lack of Transportation (Medical): Yes    Lack of Transportation (Non-Medical): Yes  Physical Activity: Insufficiently Active (08/27/2023)   Exercise Vital Sign  Days of Exercise per Week: 5 days    Minutes of Exercise per Session: 10 min  Stress: Stress Concern Present (08/27/2023)   [employer] of Occupational Health - Occupational Stress Questionnaire    Feeling of Stress: Very much  Social Connections: Socially Isolated (08/27/2023)   Social Connection and Isolation Panel    Frequency of Communication with Friends and Family: Three times a week    Frequency of Social Gatherings with Friends and Family: More than three times a week    Attends Religious Services: Never    Database administrator or Organizations: No    Attends Engineer, structural: Not on file    Marital Status: Never married  Intimate Partner Violence: Not on file     Outpatient Medications Prior to Visit  Medication Sig Dispense Refill   meloxicam  (MOBIC ) 15 MG tablet Take 1 tablet (15 mg total) by mouth daily. 30 tablet 0   ARIPiprazole  (ABILIFY ) 2 MG tablet Take 1 tablet (2 mg total) by mouth daily. 30 tablet 0   ibuprofen  (ADVIL ) 600 MG tablet Take 1 tablet (600 mg total) by mouth every 8 (eight) hours as needed. (Patient not taking: Reported on 11/16/2023) 30 tablet 0   DULoxetine  (CYMBALTA ) 60 MG capsule Take 1 capsule (60 mg total) by mouth daily. (Patient not taking: Reported on 11/16/2023) 90 capsule 1   methylPREDNISolone  (MEDROL  DOSEPAK) 4 MG TBPK tablet Follow instructions on the package. (Patient not taking: Reported on 11/16/2023) 21 tablet 0   No facility-administered medications prior to visit.    No Known Allergies  ROS Review of Systems  Constitutional:  Negative for appetite change, chills, fatigue and fever.  HENT:  Negative for congestion, postnasal drip, rhinorrhea and sneezing.   Respiratory:  Negative for cough, shortness of breath and wheezing.   Cardiovascular:  Negative for chest pain, palpitations and leg swelling.  Gastrointestinal:  Negative for abdominal pain, constipation, nausea and vomiting.  Genitourinary:  Negative for difficulty urinating, dysuria, flank pain and frequency.  Musculoskeletal:  Positive for back pain. Negative for joint swelling and myalgias.  Skin:  Positive for rash. Negative for pallor and wound.  Neurological:  Negative for dizziness, facial asymmetry, weakness, numbness and headaches.  Psychiatric/Behavioral:  Negative for behavioral problems, confusion, self-injury and suicidal ideas.       Objective:    Physical Exam Vitals and nursing note reviewed.  Constitutional:      General: He is not in acute distress.    Appearance: Normal appearance. He is obese. He is not ill-appearing, toxic-appearing or diaphoretic.  Eyes:     General: No scleral icterus.       Right eye: No discharge.         Left eye: No discharge.     Extraocular Movements: Extraocular movements intact.     Conjunctiva/sclera: Conjunctivae normal.  Cardiovascular:     Rate and Rhythm: Normal rate and regular rhythm.     Pulses: Normal pulses.     Heart sounds: Normal heart sounds. No murmur heard.    No friction rub. No gallop.  Pulmonary:     Effort: Pulmonary effort is normal. No respiratory distress.     Breath sounds: Normal breath sounds. No stridor. No wheezing, rhonchi or rales.  Chest:     Chest wall: No tenderness.  Abdominal:     General: There is no distension.     Palpations: Abdomen is soft.     Tenderness: There is no abdominal tenderness. There  is no right CVA tenderness, left CVA tenderness or guarding.  Musculoskeletal:        General: Tenderness present. No swelling, deformity or signs of injury.     Right lower leg: No edema.     Left lower leg: No edema.     Comments: Tenderness on palpation of mid low back.  Skin:    General: Skin is warm and dry.     Coloration: Skin is not jaundiced or pale.     Findings: Rash present. No bruising, erythema or lesion.     Comments: Non erythematous rashes noted on the dorsal side of  left  hand  Neurological:     Mental Status: He is alert and oriented to person, place, and time.     Motor: No weakness.     Coordination: Coordination normal.     Gait: Gait normal.  Psychiatric:        Mood and Affect: Mood normal.        Behavior: Behavior normal.        Thought Content: Thought content normal.        Judgment: Judgment normal.     BP 118/69   Pulse 70   Temp (!) 97.2 F (36.2 C)   Wt 263 lb (119.3 kg)   SpO2 100%   BMI 36.17 kg/m  Wt Readings from Last 3 Encounters:  11/16/23 263 lb (119.3 kg)  09/21/23 249 lb (112.9 kg)  09/06/23 248 lb (112.5 kg)    Lab Results  Component Value Date   TSH 2.660 04/02/2023   Lab Results  Component Value Date   WBC 5.2 02/23/2023   HGB 15.3 02/23/2023   HCT 47.9 02/23/2023    MCV 96.2 02/23/2023   PLT 234 02/23/2023   Lab Results  Component Value Date   NA 143 02/23/2023   K 3.5 02/23/2023   CO2 23 02/23/2023   GLUCOSE 90 02/23/2023   BUN 7 02/23/2023   CREATININE 0.96 02/23/2023   BILITOT 1.2 (H) 02/23/2023   ALKPHOS 61 02/23/2023   AST 34 02/23/2023   ALT 22 02/23/2023   PROT 8.5 (H) 02/23/2023   ALBUMIN 4.6 02/23/2023   CALCIUM 9.7 02/23/2023   ANIONGAP 12 02/23/2023   No results found for: CHOL No results found for: HDL No results found for: LDLCALC No results found for: TRIG No results found for: CHOLHDL No results found for: YHAJ8R    Assessment & Plan:   Problem List Items Addressed This Visit       Nervous and Auditory   Chronic midline low back pain with right-sided sciatica   He declined Toradol  injection in the office today States that he has been taking meloxicam  as needed Continue physical therapy, use of heating pad encouraged Advised to keep upcoming appointment with orthopedics      Relevant Medications   ARIPiprazole  (ABILIFY ) 2 MG tablet   DULoxetine  (CYMBALTA ) 60 MG capsule     Musculoskeletal and Integument   Rash and other nonspecific skin eruption   Advised to use OTC hydrocortisone cream as needed        Other   Anxiety and depression - Primary      11/16/2023    8:12 AM 06/29/2023    3:53 PM 05/31/2023   10:29 AM 03/30/2023    4:17 PM  GAD 7 : Generalized Anxiety Score  Nervous, Anxious, on Edge 2 3 3 3   Control/stop worrying 3 3 3 2   Worry too much - different  things 2 3 3 2   Trouble relaxing 3 3 3 2   Restless 3 3 1 2   Easily annoyed or irritable 3 3 3 3   Afraid - awful might happen 1 1 1 1   Total GAD 7 Score 17 19 17 15   Anxiety Difficulty Extremely difficult Very difficult Somewhat difficult Somewhat difficult        11/16/2023    8:09 AM 06/29/2023    3:53 PM 05/31/2023   10:25 AM  Depression screen PHQ 2/9  Decreased Interest 2 2 3   Down, Depressed, Hopeless 0 2 3  PHQ - 2  Score 2 4 6   Altered sleeping 3 2 2   Tired, decreased energy 3 2 2   Change in appetite 0 1 1  Feeling bad or failure about yourself  0 0 0  Trouble concentrating 1 3 3   Moving slowly or fidgety/restless 3 1 0  Suicidal thoughts 1 0 1  PHQ-9 Score 13 13 15   Difficult doing work/chores Somewhat difficult Very difficult Not difficult at all  Duloxetine  60 mg daily refilled Encouraged to follow-up with psychiatrist Currently denies SI, HI      Relevant Medications   DULoxetine  (CYMBALTA ) 60 MG capsule   Tobacco use disorder   Smokes about 2-3 cigarettes day  Asked about quitting: confirms that he currently smokes cigarettes Advise to quit smoking: Educated about QUITTING to reduce the risk of cancer, cardio and cerebrovascular disease. Assess willingness: Unwilling to quit at this time, but is working on cutting back. Assist with counseling and pharmacotherapy: Counseled for 5 minutes and literature provided. Arrange for follow up: follow up in 3 months and continue to offer help.       Schizoaffective disorder (HCC)   Abilify  2 mg daily refilled Patient encouraged to follow-up with psychiatrist      Relevant Medications   ARIPiprazole  (ABILIFY ) 2 MG tablet   Obesity (BMI 35.0-39.9 without comorbidity)   Wt Readings from Last 3 Encounters:  11/16/23 263 lb (119.3 kg)  09/21/23 249 lb (112.9 kg)  09/06/23 248 lb (112.5 kg)  Body mass index is 36.17 kg/m.  He is on a general diet and does not exercise Patient counseled on low-carb diet, encouraged to engage in regular moderate to vigorous exercises at least 150 minutes weekly as tolerated Benefits of healthy weights discussed       Meds ordered this encounter  Medications   DISCONTD: ARIPiprazole  (ABILIFY ) 2 MG tablet    Sig: Take 1 tablet (2 mg total) by mouth daily.    Dispense:  30 tablet    Refill:  0   DISCONTD: DULoxetine  (CYMBALTA ) 60 MG capsule    Sig: Take 1 capsule (60 mg total) by mouth daily.    Dispense:   90 capsule    Refill:  1   ARIPiprazole  (ABILIFY ) 2 MG tablet    Sig: Take 1 tablet (2 mg total) by mouth daily.    Dispense:  30 tablet    Refill:  1   DULoxetine  (CYMBALTA ) 60 MG capsule    Sig: Take 1 capsule (60 mg total) by mouth daily.    Dispense:  90 capsule    Refill:  1    Follow-up: Return in about 3 months (around 02/16/2024) for ANXIETY, DEPRESSION, CPE.    Cara Thaxton R Delilah Mulgrew, FNP

## 2023-11-16 NOTE — Patient Instructions (Signed)
 Prednisone dose pack. - Use Tylenol  500 to 1000 mg tablets 2-3 times a day for day-to-day pain relief Use heating pads over back. Continue HEP and Physical therapy. Follow up in 6 weeks.

## 2023-11-16 NOTE — Assessment & Plan Note (Addendum)
    11/16/2023    8:12 AM 06/29/2023    3:53 PM 05/31/2023   10:29 AM 03/30/2023    4:17 PM  GAD 7 : Generalized Anxiety Score  Nervous, Anxious, on Edge 2 3 3 3   Control/stop worrying 3 3 3 2   Worry too much - different things 2 3 3 2   Trouble relaxing 3 3 3 2   Restless 3 3 1 2   Easily annoyed or irritable 3 3 3 3   Afraid - awful might happen 1 1 1 1   Total GAD 7 Score 17 19 17 15   Anxiety Difficulty Extremely difficult Very difficult Somewhat difficult Somewhat difficult        11/16/2023    8:09 AM 06/29/2023    3:53 PM 05/31/2023   10:25 AM  Depression screen PHQ 2/9  Decreased Interest 2 2 3   Down, Depressed, Hopeless 0 2 3  PHQ - 2 Score 2 4 6   Altered sleeping 3 2 2   Tired, decreased energy 3 2 2   Change in appetite 0 1 1  Feeling bad or failure about yourself  0 0 0  Trouble concentrating 1 3 3   Moving slowly or fidgety/restless 3 1 0  Suicidal thoughts 1 0 1  PHQ-9 Score 13 13 15   Difficult doing work/chores Somewhat difficult Very difficult Not difficult at all  Duloxetine  60 mg daily refilled Encouraged to follow-up with psychiatrist Currently denies SI, HI

## 2023-11-16 NOTE — Assessment & Plan Note (Signed)
 Advised to use OTC hydrocortisone cream as needed

## 2023-11-16 NOTE — Assessment & Plan Note (Signed)
 Wt Readings from Last 3 Encounters:  11/16/23 263 lb (119.3 kg)  09/21/23 249 lb (112.9 kg)  09/06/23 248 lb (112.5 kg)  Body mass index is 36.17 kg/m.  He is on a general diet and does not exercise Patient counseled on low-carb diet, encouraged to engage in regular moderate to vigorous exercises at least 150 minutes weekly as tolerated Benefits of healthy weights discussed

## 2023-11-16 NOTE — Assessment & Plan Note (Addendum)
 Smokes about 2-3 cigarettes day  Asked about quitting: confirms that he currently smokes cigarettes Advise to quit smoking: Educated about QUITTING to reduce the risk of cancer, cardio and cerebrovascular disease. Assess willingness: Unwilling to quit at this time, but is working on cutting back. Assist with counseling and pharmacotherapy: Counseled for 5 minutes and literature provided. Arrange for follow up: follow up in 3 months and continue to offer help.

## 2023-11-16 NOTE — Patient Instructions (Addendum)
 Please get hydrocortisone cream over-the-counter and apply to the rashes on your hand  Please call the psychiatrist office below and schedule an appointment, let us  know if you have any trouble getting your medications [facility] [phone]   1. Tobacco use disorder (Primary)   2. Anxiety and depression  - DULoxetine  (CYMBALTA ) 60 MG capsule; Take 1 capsule (60 mg total) by mouth daily.  Dispense: 90 capsule; Refill: 1  3. Schizoaffective disorder, unspecified type (HCC)  - ARIPiprazole  (ABILIFY ) 2 MG tablet; Take 1 tablet (2 mg total) by mouth daily.  Dispense: 30 tablet; Refill: 0    It is important that you exercise regularly at least 30 minutes 5 times a week as tolerated  Think about what you will eat, plan ahead. Choose  clean, green, fresh or frozen over canned, processed or packaged foods which are more sugary, salty and fatty. 70 to 75% of food eaten should be vegetables and fruit. Three meals at set times with snacks allowed between meals, but they must be fruit or vegetables. Aim to eat over a 12 hour period , example 7 am to 7 pm, and STOP after  your last meal of the day. Drink water,generally about 64 ounces per day, no other drink is as healthy. Fruit juice is best enjoyed in a healthy way, by EATING the fruit.  Thanks for choosing [facility] we consider it a privelige to serve you.

## 2023-11-16 NOTE — Assessment & Plan Note (Signed)
 Abilify  2 mg daily refilled Patient encouraged to follow-up with psychiatrist

## 2023-11-16 NOTE — Assessment & Plan Note (Signed)
 He declined Toradol  injection in the office today States that he has been taking meloxicam  as needed Continue physical therapy, use of heating pad encouraged Advised to keep upcoming appointment with orthopedics

## 2023-11-23 ENCOUNTER — Ambulatory Visit: Payer: Self-pay | Attending: Sports Medicine | Admitting: Physical Therapy

## 2023-11-23 DIAGNOSIS — M6281 Muscle weakness (generalized): Secondary | ICD-10-CM | POA: Insufficient documentation

## 2023-11-23 DIAGNOSIS — R2689 Other abnormalities of gait and mobility: Secondary | ICD-10-CM | POA: Insufficient documentation

## 2023-11-23 DIAGNOSIS — M5459 Other low back pain: Secondary | ICD-10-CM | POA: Insufficient documentation

## 2023-11-23 DIAGNOSIS — R2681 Unsteadiness on feet: Secondary | ICD-10-CM | POA: Insufficient documentation
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
## 2023-11-24 ENCOUNTER — Ambulatory Visit: Payer: Self-pay | Admitting: Nurse Practitioner

## 2023-11-25 ENCOUNTER — Ambulatory Visit: Payer: Self-pay | Admitting: Physical Therapy

## 2023-11-25 ENCOUNTER — Ambulatory Visit (HOSPITAL_COMMUNITY): Payer: Self-pay

## 2023-11-25 ENCOUNTER — Ambulatory Visit (HOSPITAL_COMMUNITY)
Admission: EM | Admit: 2023-11-25 | Discharge: 2023-11-25 | Disposition: A | Discharge status: Home / Self Care | Payer: Self-pay | Attending: Physician Assistant | Admitting: Physician Assistant

## 2023-11-25 ENCOUNTER — Other Ambulatory Visit: Payer: Self-pay

## 2023-11-25 ENCOUNTER — Encounter (HOSPITAL_COMMUNITY): Payer: Self-pay | Admitting: *Deleted

## 2023-11-25 DIAGNOSIS — Z113 Encounter for screening for infections with a predominantly sexual mode of transmission: Secondary | ICD-10-CM | POA: Insufficient documentation

## 2023-11-25 NOTE — ED Provider Notes (Addendum)
 [facility]    CSN: [id number] Arrival date & time: 11/25/23  1558      History   Chief Complaint Chief Complaint  Patient presents with   SEXUALLY TRANSMITTED DISEASE    HPI Joe [name] is a 33 y.o. male.   Patient presents for STD testing.  He is currently asymptomatic.  He reports unprotected sex about 1 month ago.    Past Medical History:  Diagnosis Date   Anxiety and depression 03/30/2023   Asthma    MDD (major depressive disorder), severe (HCC) 10/16/2017   Methamphetamine abuse (HCC) 10/16/2017    Patient Active Problem List   Diagnosis Date Noted   Schizoaffective disorder (HCC) 11/16/2023   Obesity (BMI 35.0-39.9 without comorbidity) 11/16/2023   Rash and other nonspecific skin eruption 11/16/2023   Chronic midline low back pain with right-sided sciatica 05/31/2023   Anxiety and depression 03/30/2023   Need for influenza vaccination 03/30/2023   Chronic right shoulder pain 03/30/2023   Marijuana abuse 03/30/2023   Tobacco use disorder 03/30/2023   Methamphetamine abuse (HCC) 10/16/2017   MDD (major depressive disorder), severe (HCC) 10/16/2017    Past Surgical History:  Procedure Laterality Date   KNEE SURGERY         Home Medications    Prior to Admission medications   Medication Sig Start Date End Date Taking? Authorizing Provider  ARIPiprazole  (ABILIFY ) 2 MG tablet Take 1 tablet (2 mg total) by mouth daily. 11/16/23  Yes Paseda, Folashade R, FNP  DULoxetine  (CYMBALTA ) 60 MG capsule Take 1 capsule (60 mg total) by mouth daily. 11/16/23   Paseda, Folashade R, FNP  ibuprofen  (ADVIL ) 600 MG tablet Take 1 tablet (600 mg total) by mouth every 8 (eight) hours as needed. Patient not taking: Reported on 11/16/2023 05/31/23   Paseda, Folashade R, FNP  meloxicam  (MOBIC ) 15 MG tablet Take 1 tablet (15 mg total) by mouth daily. 06/07/23   Leonce Katz, DO  methylPREDNISolone  (MEDROL  DOSEPAK) 4 MG TBPK tablet Follow instructions on package.  11/16/23   Leonce Katz, DO    Family History Family History  Problem Relation Age of Onset   Diabetes Father     Social History Social History   Tobacco Use   Smoking status: Every Day    Current packs/day: 0.50    Types: Cigarettes   Smokeless tobacco: Never  Vaping Use   Vaping status: Former  Substance Use Topics   Alcohol use: Yes    Comment: recent increase   Drug use: Yes    Types: Marijuana    Comment: occ     Allergies   Patient has no known allergies.   Review of Systems Review of Systems  Constitutional:  Negative for chills and fever.  HENT:  Negative for ear pain and sore throat.   Eyes:  Negative for pain and visual disturbance.  Respiratory:  Negative for cough and shortness of breath.   Cardiovascular:  Negative for chest pain and palpitations.  Gastrointestinal:  Negative for abdominal pain and vomiting.  Genitourinary:  Negative for dysuria and hematuria.  Musculoskeletal:  Negative for arthralgias and back pain.  Skin:  Negative for color change and rash.  Neurological:  Negative for seizures and syncope.  All other systems reviewed and are negative.    Physical Exam Triage Vital Signs ED Triage Vitals  Encounter Vitals Group     BP 11/25/23 1630 (!) 148/100     Girls Systolic BP Percentile --      Girls  Diastolic BP Percentile --      Boys Systolic BP Percentile --      Boys Diastolic BP Percentile --      Pulse Rate 11/25/23 1630 85     Resp 11/25/23 1630 20     Temp 11/25/23 1630 98.4 F (36.9 C)     Temp src --      SpO2 11/25/23 1630 98 %     Weight --      Height --      Head Circumference --      Peak Flow --      Pain Score 11/25/23 1628 0     Pain Loc --      Pain Education --      Exclude from Growth Chart --    No data found.  Updated Vital Signs BP (!) 148/100   Pulse 85   Temp 98.4 F (36.9 C)   Resp 20   SpO2 98%   Visual Acuity Right Eye Distance:   Left Eye Distance:   Bilateral Distance:     Right Eye Near:   Left Eye Near:    Bilateral Near:     Physical Exam Vitals and nursing note reviewed.  Constitutional:      General: He is not in acute distress.    Appearance: He is well-developed.  HENT:     Head: Normocephalic and atraumatic.  Eyes:     Conjunctiva/sclera: Conjunctivae normal.  Cardiovascular:     Rate and Rhythm: Normal rate and regular rhythm.     Heart sounds: No murmur heard. Pulmonary:     Effort: Pulmonary effort is normal. No respiratory distress.     Breath sounds: Normal breath sounds.  Abdominal:     Palpations: Abdomen is soft.     Tenderness: There is no abdominal tenderness.  Musculoskeletal:        General: No swelling.     Cervical back: Neck supple.  Skin:    General: Skin is warm and dry.     Capillary Refill: Capillary refill takes less than 2 seconds.  Neurological:     Mental Status: He is alert.  Psychiatric:        Mood and Affect: Mood normal.      UC Treatments / Results  Labs (all labs ordered are listed, but only abnormal results are displayed) Labs Reviewed  HIV ANTIBODY (ROUTINE TESTING W REFLEX)  RPR  CYTOLOGY, (ORAL, ANAL, URETHRAL) ANCILLARY ONLY    EKG   Radiology No results found.  Procedures Procedures (including critical care time)  Medications Ordered in UC Medications - No data to display  Initial Impression / Assessment and Plan / UC Course  I have reviewed the triage vital signs and the nursing notes.  Pertinent labs & imaging results that were available during my care of the patient were reviewed by me and considered in my medical decision making (see chart for details).     STD screening today.  Cytology, HIV, RPR in clinic today.  Pending results.  Will treat if indicated based on results.  Addendum: difficult stick, unable to find a vein.  Three different nurses tried.  Advised pt to drink plenty of fluids and return for nurse visit tomorrow.  Final Clinical Impressions(s) / UC  Diagnoses   Final diagnoses:  Screen for STD (sexually transmitted disease)     Discharge Instructions      Will call with test results and initiate any necessary treatments. Recommend abstaining from  sexual intercourse until labs are back and any treatment is completed if needed.    ED Prescriptions   None    PDMP not reviewed this encounter.   Ward, Harlene PEDLAR, PA-C 11/25/23 1652    Ward, Harlene PEDLAR, PA-C 11/25/23 1730

## 2023-11-25 NOTE — ED Triage Notes (Signed)
 PT had unprotected sex one month ago and wants to be checked for STD. Pt denies any Sx's.

## 2023-11-25 NOTE — Discharge Instructions (Signed)
 Will call with test results and initiate any necessary treatments. Recommend abstaining from sexual intercourse until labs are back and any treatment is completed if needed.

## 2023-11-26 ENCOUNTER — Other Ambulatory Visit (HOSPITAL_COMMUNITY): Payer: Self-pay

## 2023-11-26 LAB — CYTOLOGY, (ORAL, ANAL, URETHRAL) ANCILLARY ONLY
Chlamydia: NEGATIVE
Comment: NEGATIVE
Comment: NEGATIVE
Comment: NORMAL
Neisseria Gonorrhea: NEGATIVE
Trichomonas: NEGATIVE

## 2023-11-30 ENCOUNTER — Other Ambulatory Visit: Payer: Self-pay | Admitting: Licensed Clinical Social Worker

## 2023-12-01 ENCOUNTER — Ambulatory Visit: Payer: Self-pay | Admitting: Physical Therapy

## 2023-12-03 ENCOUNTER — Encounter: Payer: Self-pay | Admitting: Physical Therapy

## 2023-12-03 ENCOUNTER — Ambulatory Visit: Payer: Self-pay | Admitting: Physical Therapy
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
## 2023-12-07 ENCOUNTER — Ambulatory Visit: Payer: Self-pay | Admitting: Physical Therapy

## 2023-12-07 ENCOUNTER — Ambulatory Visit (HOSPITAL_BASED_OUTPATIENT_CLINIC_OR_DEPARTMENT_OTHER): Payer: Self-pay | Admitting: Family

## 2023-12-07 ENCOUNTER — Other Ambulatory Visit (HOSPITAL_COMMUNITY): Payer: Self-pay

## 2023-12-07 DIAGNOSIS — F259 Schizoaffective disorder, unspecified: Secondary | ICD-10-CM

## 2023-12-07 MED ORDER — ARIPIPRAZOLE ER 400 MG IM PRSY
400.0000 mg | PREFILLED_SYRINGE | Freq: Once | INTRAMUSCULAR | Status: AC
  Administered 2023-12-13: 400 mg via INTRAMUSCULAR

## 2023-12-07 MED ORDER — ARIPIPRAZOLE 2 MG PO TABS
2.0000 mg | ORAL_TABLET | Freq: Every day | ORAL | 0 refills | Status: AC
  Filled 2023-12-07: qty 7, 7d supply, fill #0
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
## 2023-12-08 ENCOUNTER — Telehealth: Payer: Self-pay | Admitting: *Deleted

## 2023-12-08 ENCOUNTER — Encounter: Admitting: Nurse Practitioner

## 2023-12-08 NOTE — Progress Notes (Unsigned)
 Complex Care Management Care Guide Note  12/08/2023 Name: Joe [name] MRN: [id number] DOB: 03/01/1991  Joe [name] is a 33 y.o. year old male who is a primary care patient of Paseda, Folashade R, FNP and is actively engaged with the care management team. I reached out to Vila Goldmann by phone today to assist with re-scheduling  with the Licensed Clinical Child psychotherapist.  Follow up plan: Unsuccessful telephone outreach attempt made. A HIPAA compliant phone message was left for the patient providing contact information and requesting a return call.  Harlene Satterfield  [facility]  Value-Based Care Institute, [facility] Guide  Direct Dial: [phone]  Fax [phone]

## 2023-12-09 NOTE — Progress Notes (Signed)
 Complex Care Management Care Guide Note  12/09/2023 Name: Joe [name] MRN: [id number] DOB: [birth year]  [name] is a 33 y.o. year old male who is a primary care patient of Paseda, Folashade R, FNP and is actively engaged with the care management team. I reached out to Vila Goldmann by phone today to assist with re-scheduling  with the Licensed Clinical Child psychotherapist.  Follow up plan: Unsuccessful telephone outreach attempt made. A HIPAA compliant phone message was left for the patient providing contact information and requesting a return call. No further outreach attempts will be made at this time. We have been unable to contact the patient to reschedule for complex care management services.  Harlene Satterfield  [facility]  Value[facility]Based Care [facility], [facility] Guide  Direct Dial: [phone]  Fax [phone]

## 2023-12-13 ENCOUNTER — Other Ambulatory Visit: Payer: Self-pay

## 2023-12-13 ENCOUNTER — Encounter (HOSPITAL_COMMUNITY): Payer: Self-pay

## 2023-12-13 ENCOUNTER — Ambulatory Visit (HOSPITAL_BASED_OUTPATIENT_CLINIC_OR_DEPARTMENT_OTHER): Payer: Self-pay

## 2023-12-13 ENCOUNTER — Other Ambulatory Visit (HOSPITAL_COMMUNITY): Payer: Self-pay

## 2023-12-13 VITALS — BP 143/83 | HR 99 | Ht 72.0 in | Wt 262.0 lb

## 2023-12-13 DIAGNOSIS — F259 Schizoaffective disorder, unspecified: Secondary | ICD-10-CM

## 2023-12-13 MED ORDER — ABILIFY MAINTENA 400 MG IM SRER: 400.0000 mg | INTRAMUSCULAR | 11 refills | Status: DC

## 2023-12-13 NOTE — Progress Notes (Signed)
 Patient arrived today for his overdue injection of Abilify  Maintena 400 mg. Patient is well groomed and has a pleasant affect. Patient denies any SI/HI or AVH. The injection was prepared and administered in patients Right Deltoid. Patient tolerated well and without complaint

## 2023-12-24 ENCOUNTER — Other Ambulatory Visit (HOSPITAL_COMMUNITY): Payer: Self-pay

## 2023-12-24 MED ORDER — ABILIFY MAINTENA 400 MG IM PRSY: 400.0000 mg | PREFILLED_SYRINGE | INTRAMUSCULAR | 11 refills | Status: AC
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
## 2023-12-28 ENCOUNTER — Ambulatory Visit: Payer: Self-pay | Admitting: Sports Medicine

## 2024-01-03 ENCOUNTER — Ambulatory Visit: Payer: Self-pay | Admitting: Nurse Practitioner
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
## 2024-01-04 ENCOUNTER — Ambulatory Visit: Payer: Self-pay | Admitting: Sports Medicine

## 2024-01-11 ENCOUNTER — Ambulatory Visit (HOSPITAL_COMMUNITY): Payer: Self-pay

## 2024-01-18 ENCOUNTER — Encounter: Payer: Self-pay | Admitting: Nurse Practitioner

## 2024-01-21 ENCOUNTER — Ambulatory Visit: Payer: Self-pay | Admitting: Nurse Practitioner

## 2024-02-07 ENCOUNTER — Encounter: Payer: Self-pay | Admitting: Nurse Practitioner

## 2024-02-07 ENCOUNTER — Encounter: Admitting: Nurse Practitioner

## 2024-03-14 ENCOUNTER — Ambulatory Visit: Payer: Self-pay | Admitting: Sports Medicine

## 2024-03-20 ENCOUNTER — Ambulatory Visit: Payer: Self-pay | Admitting: Nurse Practitioner

## 2024-03-22 ENCOUNTER — Ambulatory Visit: Payer: Self-pay | Admitting: Nurse Practitioner

## 2024-04-24 ENCOUNTER — Ambulatory Visit: Payer: Self-pay | Admitting: Nurse Practitioner

## 2024-04-24 NOTE — Telephone Encounter (Signed)
 Reached out and left a V/M to R/S appointment. Also reached out through MyChart.  04/24/24

## 2024-04-27 ENCOUNTER — Encounter: Payer: Self-pay | Admitting: Nurse Practitioner

## 2024-05-05 ENCOUNTER — Telehealth: Payer: Self-pay | Admitting: Sports Medicine

## 2024-05-05 ENCOUNTER — Encounter: Payer: Self-pay | Admitting: Sports Medicine

## 2024-05-05 NOTE — Telephone Encounter (Signed)
 Patient has no showed 3 times (07/05/23, 01/04/24 and 11/25). Original no show letter was sent on 07/07/23.  Please advise on dismissal due to multiple no shows.

## 2024-05-05 NOTE — Telephone Encounter (Signed)
 Pt dismissed.  Letter mailed.

## 2024-05-31 ENCOUNTER — Encounter: Payer: Self-pay | Admitting: Nurse Practitioner
